# Patient Record
Sex: Male | Born: 1940 | Race: White | Hispanic: No | Marital: Married
Health system: Southern US, Community
[De-identification: ages and names within clinical notes are randomized; demographics above are authoritative.]

---

## 1997-09-10 ENCOUNTER — Ambulatory Visit (HOSPITAL_COMMUNITY): Admission: RE | Admit: 1997-09-10 | Discharge: 1997-09-10 | Payer: Self-pay | Admitting: Neurosurgery

## 1998-05-30 ENCOUNTER — Ambulatory Visit (HOSPITAL_COMMUNITY): Admission: RE | Admit: 1998-05-30 | Discharge: 1998-05-30 | Payer: Self-pay | Admitting: Neurosurgery

## 1998-05-30 ENCOUNTER — Encounter: Payer: Self-pay | Admitting: Neurosurgery

## 1998-10-24 ENCOUNTER — Encounter: Payer: Self-pay | Admitting: Neurosurgery

## 1998-10-24 ENCOUNTER — Ambulatory Visit (HOSPITAL_COMMUNITY): Admission: RE | Admit: 1998-10-24 | Discharge: 1998-10-24 | Payer: Self-pay | Admitting: Neurosurgery

## 1999-03-10 ENCOUNTER — Encounter: Payer: Self-pay | Admitting: Neurosurgery

## 1999-03-10 ENCOUNTER — Ambulatory Visit (HOSPITAL_COMMUNITY): Admission: RE | Admit: 1999-03-10 | Discharge: 1999-03-10 | Payer: Self-pay | Admitting: Neurosurgery

## 1999-12-01 ENCOUNTER — Encounter: Payer: Self-pay | Admitting: Neurosurgery

## 1999-12-02 ENCOUNTER — Ambulatory Visit (HOSPITAL_COMMUNITY): Admission: RE | Admit: 1999-12-02 | Discharge: 1999-12-02 | Payer: Self-pay | Admitting: Neurosurgery

## 2000-04-05 ENCOUNTER — Inpatient Hospital Stay (HOSPITAL_COMMUNITY): Admission: EM | Admit: 2000-04-05 | Discharge: 2000-04-06 | Payer: Self-pay | Admitting: *Deleted

## 2003-10-05 ENCOUNTER — Emergency Department (HOSPITAL_COMMUNITY): Admission: EM | Admit: 2003-10-05 | Discharge: 2003-10-05 | Payer: Self-pay | Admitting: Emergency Medicine

## 2004-05-22 ENCOUNTER — Ambulatory Visit: Payer: Self-pay | Admitting: Cardiology

## 2004-05-22 ENCOUNTER — Inpatient Hospital Stay (HOSPITAL_COMMUNITY): Admission: AD | Admit: 2004-05-22 | Discharge: 2004-05-25 | Payer: Self-pay | Admitting: Cardiology

## 2004-06-03 ENCOUNTER — Ambulatory Visit: Payer: Self-pay | Admitting: Internal Medicine

## 2004-06-08 ENCOUNTER — Ambulatory Visit: Payer: Self-pay | Admitting: Cardiology

## 2004-06-22 ENCOUNTER — Ambulatory Visit: Payer: Self-pay | Admitting: Cardiology

## 2004-06-26 ENCOUNTER — Ambulatory Visit: Payer: Self-pay | Admitting: Cardiology

## 2004-07-22 ENCOUNTER — Ambulatory Visit: Payer: Self-pay | Admitting: Cardiology

## 2004-08-24 ENCOUNTER — Ambulatory Visit: Payer: Self-pay | Admitting: Cardiology

## 2004-10-07 ENCOUNTER — Ambulatory Visit: Payer: Self-pay | Admitting: Cardiology

## 2004-10-29 ENCOUNTER — Emergency Department (HOSPITAL_COMMUNITY): Admission: EM | Admit: 2004-10-29 | Discharge: 2004-10-29 | Payer: Self-pay | Admitting: Emergency Medicine

## 2004-10-29 ENCOUNTER — Ambulatory Visit: Payer: Self-pay | Admitting: Internal Medicine

## 2004-10-30 ENCOUNTER — Ambulatory Visit: Payer: Self-pay | Admitting: Internal Medicine

## 2004-12-01 ENCOUNTER — Ambulatory Visit: Payer: Self-pay | Admitting: Internal Medicine

## 2004-12-04 ENCOUNTER — Emergency Department (HOSPITAL_COMMUNITY): Admission: EM | Admit: 2004-12-04 | Discharge: 2004-12-04 | Payer: Self-pay | Admitting: Emergency Medicine

## 2004-12-14 ENCOUNTER — Ambulatory Visit: Payer: Self-pay | Admitting: Cardiology

## 2005-01-20 ENCOUNTER — Ambulatory Visit: Payer: Self-pay

## 2005-02-10 ENCOUNTER — Ambulatory Visit: Payer: Self-pay | Admitting: Cardiology

## 2005-02-13 ENCOUNTER — Emergency Department (HOSPITAL_COMMUNITY): Admission: EM | Admit: 2005-02-13 | Discharge: 2005-02-13 | Payer: Self-pay | Admitting: Emergency Medicine

## 2005-02-19 ENCOUNTER — Ambulatory Visit: Payer: Self-pay | Admitting: Cardiology

## 2005-03-04 ENCOUNTER — Ambulatory Visit: Payer: Self-pay | Admitting: Cardiology

## 2005-03-26 ENCOUNTER — Ambulatory Visit: Payer: Self-pay | Admitting: Cardiology

## 2005-05-20 ENCOUNTER — Ambulatory Visit: Payer: Self-pay | Admitting: Cardiology

## 2005-06-15 ENCOUNTER — Ambulatory Visit: Payer: Self-pay | Admitting: Cardiology

## 2005-07-01 ENCOUNTER — Ambulatory Visit: Payer: Self-pay | Admitting: Cardiology

## 2005-08-31 ENCOUNTER — Ambulatory Visit: Payer: Self-pay | Admitting: Cardiology

## 2006-01-12 ENCOUNTER — Ambulatory Visit: Payer: Self-pay | Admitting: Cardiology

## 2006-01-12 ENCOUNTER — Observation Stay (HOSPITAL_COMMUNITY): Admission: AD | Admit: 2006-01-12 | Discharge: 2006-01-13 | Payer: Self-pay | Admitting: Cardiology

## 2006-01-25 ENCOUNTER — Encounter: Payer: Self-pay | Admitting: Cardiology

## 2006-01-25 ENCOUNTER — Ambulatory Visit: Payer: Self-pay

## 2006-02-03 ENCOUNTER — Ambulatory Visit: Payer: Self-pay | Admitting: Cardiology

## 2006-03-14 ENCOUNTER — Ambulatory Visit: Payer: Self-pay | Admitting: Cardiology

## 2006-04-24 ENCOUNTER — Emergency Department (HOSPITAL_COMMUNITY): Admission: EM | Admit: 2006-04-24 | Discharge: 2006-04-24 | Payer: Self-pay | Admitting: Emergency Medicine

## 2006-04-26 ENCOUNTER — Ambulatory Visit: Payer: Self-pay | Admitting: Cardiology

## 2006-05-04 ENCOUNTER — Ambulatory Visit: Payer: Self-pay | Admitting: Internal Medicine

## 2006-05-04 ENCOUNTER — Ambulatory Visit: Payer: Self-pay

## 2006-05-06 ENCOUNTER — Ambulatory Visit: Payer: Self-pay | Admitting: Gastroenterology

## 2006-05-18 ENCOUNTER — Ambulatory Visit: Payer: Self-pay | Admitting: Cardiology

## 2006-05-31 ENCOUNTER — Ambulatory Visit: Payer: Self-pay | Admitting: Internal Medicine

## 2006-06-16 ENCOUNTER — Ambulatory Visit: Payer: Self-pay

## 2006-06-21 ENCOUNTER — Ambulatory Visit: Payer: Self-pay | Admitting: Cardiology

## 2006-07-13 ENCOUNTER — Ambulatory Visit: Payer: Self-pay | Admitting: Internal Medicine

## 2006-10-28 ENCOUNTER — Emergency Department (HOSPITAL_COMMUNITY): Admission: EM | Admit: 2006-10-28 | Discharge: 2006-10-28 | Payer: Self-pay | Admitting: Emergency Medicine

## 2006-11-02 ENCOUNTER — Ambulatory Visit: Payer: Self-pay

## 2006-11-08 ENCOUNTER — Ambulatory Visit: Payer: Self-pay | Admitting: Cardiology

## 2006-12-05 ENCOUNTER — Ambulatory Visit: Payer: Self-pay | Admitting: Gastroenterology

## 2006-12-05 LAB — CONVERTED CEMR LAB
Albumin: 3.9 g/dL (ref 3.5–5.2)
Alkaline Phosphatase: 44 units/L (ref 39–117)
BUN: 14 mg/dL (ref 6–23)
Basophils Absolute: 0 10*3/uL (ref 0.0–0.1)
Calcium: 8.9 mg/dL (ref 8.4–10.5)
Eosinophils Absolute: 0.1 10*3/uL (ref 0.0–0.6)
GFR calc non Af Amer: 54 mL/min
Glucose, Bld: 115 mg/dL — ABNORMAL HIGH (ref 70–99)
Lymphocytes Relative: 20.5 % (ref 12.0–46.0)
MCHC: 34.4 g/dL (ref 30.0–36.0)
MCV: 93.7 fL (ref 78.0–100.0)
Neutrophils Relative %: 68.3 % (ref 43.0–77.0)
Platelets: 176 10*3/uL (ref 150–400)
RBC: 4.4 M/uL (ref 4.22–5.81)
Total Bilirubin: 1.3 mg/dL — ABNORMAL HIGH (ref 0.3–1.2)
Total Protein: 6.9 g/dL (ref 6.0–8.3)

## 2006-12-06 ENCOUNTER — Ambulatory Visit: Payer: Self-pay | Admitting: Gastroenterology

## 2007-01-11 ENCOUNTER — Ambulatory Visit: Payer: Self-pay | Admitting: Internal Medicine

## 2007-01-13 ENCOUNTER — Ambulatory Visit: Payer: Self-pay | Admitting: Internal Medicine

## 2007-01-13 LAB — CONVERTED CEMR LAB
Cholesterol: 136 mg/dL (ref 0–200)
Glucose, 2 hour: 253 mg/dL — ABNORMAL HIGH (ref 70–139)
HDL: 22.5 mg/dL — ABNORMAL LOW (ref >39.0)
PSA: 0.75 ng/mL (ref 0.10–4.00)
Total CHOL/HDL Ratio: 6

## 2007-01-16 ENCOUNTER — Ambulatory Visit: Payer: Self-pay | Admitting: Internal Medicine

## 2007-01-30 ENCOUNTER — Encounter: Payer: Self-pay | Admitting: Internal Medicine

## 2007-01-30 ENCOUNTER — Ambulatory Visit: Payer: Self-pay | Admitting: Internal Medicine

## 2007-02-08 ENCOUNTER — Ambulatory Visit (HOSPITAL_COMMUNITY): Admission: RE | Admit: 2007-02-08 | Discharge: 2007-02-09 | Payer: Self-pay | Admitting: Surgery

## 2007-02-08 ENCOUNTER — Encounter (INDEPENDENT_AMBULATORY_CARE_PROVIDER_SITE_OTHER): Payer: Self-pay | Admitting: Surgery

## 2007-02-14 ENCOUNTER — Ambulatory Visit: Payer: Self-pay | Admitting: Internal Medicine

## 2007-02-14 ENCOUNTER — Encounter: Payer: Self-pay | Admitting: Internal Medicine

## 2007-03-07 ENCOUNTER — Ambulatory Visit: Payer: Self-pay | Admitting: Cardiology

## 2007-03-30 ENCOUNTER — Ambulatory Visit: Payer: Self-pay | Admitting: Cardiology

## 2007-04-10 ENCOUNTER — Ambulatory Visit: Payer: Self-pay | Admitting: Cardiology

## 2007-04-10 ENCOUNTER — Ambulatory Visit: Payer: Self-pay

## 2007-05-24 ENCOUNTER — Observation Stay (HOSPITAL_COMMUNITY): Admission: EM | Admit: 2007-05-24 | Discharge: 2007-05-25 | Payer: Self-pay | Admitting: Emergency Medicine

## 2007-05-24 ENCOUNTER — Ambulatory Visit: Payer: Self-pay | Admitting: Internal Medicine

## 2007-05-29 ENCOUNTER — Ambulatory Visit: Payer: Self-pay | Admitting: Internal Medicine

## 2007-05-30 DIAGNOSIS — Z87442 Personal history of urinary calculi: Secondary | ICD-10-CM

## 2007-05-30 DIAGNOSIS — I10 Essential (primary) hypertension: Secondary | ICD-10-CM | POA: Insufficient documentation

## 2007-05-30 DIAGNOSIS — M109 Gout, unspecified: Secondary | ICD-10-CM

## 2007-05-30 DIAGNOSIS — I2589 Other forms of chronic ischemic heart disease: Secondary | ICD-10-CM | POA: Insufficient documentation

## 2007-05-30 DIAGNOSIS — I498 Other specified cardiac arrhythmias: Secondary | ICD-10-CM

## 2007-05-30 DIAGNOSIS — E785 Hyperlipidemia, unspecified: Secondary | ICD-10-CM

## 2007-05-30 DIAGNOSIS — Z8612 Personal history of poliomyelitis: Secondary | ICD-10-CM

## 2007-05-30 DIAGNOSIS — I739 Peripheral vascular disease, unspecified: Secondary | ICD-10-CM

## 2007-05-30 DIAGNOSIS — K8020 Calculus of gallbladder without cholecystitis without obstruction: Secondary | ICD-10-CM | POA: Insufficient documentation

## 2007-05-30 DIAGNOSIS — C4359 Malignant melanoma of other part of trunk: Secondary | ICD-10-CM

## 2007-06-05 ENCOUNTER — Ambulatory Visit: Payer: Self-pay | Admitting: Cardiology

## 2007-06-09 ENCOUNTER — Ambulatory Visit: Payer: Self-pay | Admitting: Cardiology

## 2007-07-17 ENCOUNTER — Ambulatory Visit: Payer: Self-pay | Admitting: Cardiology

## 2007-07-21 ENCOUNTER — Telehealth: Payer: Self-pay | Admitting: Internal Medicine

## 2007-08-22 DIAGNOSIS — K219 Gastro-esophageal reflux disease without esophagitis: Secondary | ICD-10-CM | POA: Insufficient documentation

## 2007-09-26 ENCOUNTER — Telehealth: Payer: Self-pay | Admitting: Internal Medicine

## 2007-10-24 ENCOUNTER — Ambulatory Visit: Payer: Self-pay

## 2007-12-05 ENCOUNTER — Encounter: Payer: Self-pay | Admitting: Internal Medicine

## 2007-12-11 ENCOUNTER — Ambulatory Visit: Payer: Self-pay | Admitting: Internal Medicine

## 2007-12-11 DIAGNOSIS — M549 Dorsalgia, unspecified: Secondary | ICD-10-CM | POA: Insufficient documentation

## 2007-12-11 DIAGNOSIS — I872 Venous insufficiency (chronic) (peripheral): Secondary | ICD-10-CM | POA: Insufficient documentation

## 2007-12-14 ENCOUNTER — Encounter: Payer: Self-pay | Admitting: Internal Medicine

## 2007-12-14 ENCOUNTER — Emergency Department (HOSPITAL_COMMUNITY): Admission: EM | Admit: 2007-12-14 | Discharge: 2007-12-14 | Payer: Self-pay | Admitting: Emergency Medicine

## 2007-12-14 ENCOUNTER — Ambulatory Visit: Payer: Self-pay | Admitting: Internal Medicine

## 2007-12-20 ENCOUNTER — Ambulatory Visit: Payer: Self-pay | Admitting: Cardiology

## 2008-01-17 ENCOUNTER — Ambulatory Visit: Payer: Self-pay | Admitting: Cardiology

## 2008-02-20 ENCOUNTER — Ambulatory Visit: Payer: Self-pay | Admitting: Cardiology

## 2008-05-15 ENCOUNTER — Ambulatory Visit: Payer: Self-pay | Admitting: Cardiology

## 2008-06-27 ENCOUNTER — Ambulatory Visit: Payer: Self-pay | Admitting: Cardiology

## 2008-06-27 LAB — CONVERTED CEMR LAB
BUN: 19 mg/dL (ref 6–23)
Glucose, Bld: 113 mg/dL — ABNORMAL HIGH (ref 70–99)
Potassium: 3.5 meq/L (ref 3.5–5.1)
Sodium: 143 meq/L (ref 135–145)

## 2008-07-31 ENCOUNTER — Telehealth: Payer: Self-pay | Admitting: Internal Medicine

## 2008-08-01 ENCOUNTER — Ambulatory Visit: Payer: Self-pay | Admitting: Cardiology

## 2008-08-01 ENCOUNTER — Encounter: Payer: Self-pay | Admitting: Cardiology

## 2008-08-08 ENCOUNTER — Telehealth: Payer: Self-pay | Admitting: Internal Medicine

## 2008-08-13 ENCOUNTER — Telehealth: Payer: Self-pay | Admitting: Internal Medicine

## 2008-09-02 ENCOUNTER — Telehealth: Payer: Self-pay | Admitting: Internal Medicine

## 2008-09-09 ENCOUNTER — Telehealth: Payer: Self-pay | Admitting: Internal Medicine

## 2008-10-14 ENCOUNTER — Ambulatory Visit: Payer: Self-pay | Admitting: Internal Medicine

## 2008-10-14 ENCOUNTER — Ambulatory Visit: Payer: Self-pay | Admitting: Cardiology

## 2008-10-14 ENCOUNTER — Observation Stay (HOSPITAL_COMMUNITY): Admission: EM | Admit: 2008-10-14 | Discharge: 2008-10-15 | Payer: Self-pay | Admitting: Emergency Medicine

## 2008-10-15 ENCOUNTER — Encounter (INDEPENDENT_AMBULATORY_CARE_PROVIDER_SITE_OTHER): Payer: Self-pay | Admitting: Internal Medicine

## 2008-10-23 ENCOUNTER — Encounter: Payer: Self-pay | Admitting: Cardiology

## 2008-10-25 ENCOUNTER — Ambulatory Visit: Payer: Self-pay | Admitting: Cardiology

## 2008-11-01 ENCOUNTER — Telehealth: Payer: Self-pay | Admitting: Internal Medicine

## 2008-11-07 ENCOUNTER — Telehealth: Payer: Self-pay | Admitting: Internal Medicine

## 2008-11-07 ENCOUNTER — Ambulatory Visit: Payer: Self-pay | Admitting: Internal Medicine

## 2008-11-07 LAB — CONVERTED CEMR LAB
Calcium: 8.4 mg/dL (ref 8.4–10.5)
GFR calc non Af Amer: 53.58 mL/min (ref >60)

## 2008-11-18 ENCOUNTER — Encounter: Payer: Self-pay | Admitting: Cardiology

## 2008-11-19 ENCOUNTER — Ambulatory Visit: Payer: Self-pay | Admitting: Cardiology

## 2008-11-21 ENCOUNTER — Telehealth: Payer: Self-pay | Admitting: Cardiology

## 2008-11-25 ENCOUNTER — Telehealth: Payer: Self-pay | Admitting: Cardiology

## 2008-12-21 ENCOUNTER — Encounter: Payer: Self-pay | Admitting: Internal Medicine

## 2009-02-11 ENCOUNTER — Telehealth: Payer: Self-pay | Admitting: Cardiology

## 2009-03-05 ENCOUNTER — Encounter: Payer: Self-pay | Admitting: Internal Medicine

## 2009-03-05 ENCOUNTER — Encounter: Payer: Self-pay | Admitting: Cardiology

## 2009-03-06 ENCOUNTER — Encounter: Payer: Self-pay | Admitting: Cardiology

## 2009-03-06 DIAGNOSIS — R42 Dizziness and giddiness: Secondary | ICD-10-CM | POA: Insufficient documentation

## 2009-03-06 DIAGNOSIS — I6529 Occlusion and stenosis of unspecified carotid artery: Secondary | ICD-10-CM

## 2009-03-07 ENCOUNTER — Ambulatory Visit: Payer: Self-pay | Admitting: Cardiology

## 2009-03-07 DIAGNOSIS — M216X9 Other acquired deformities of unspecified foot: Secondary | ICD-10-CM

## 2009-03-11 ENCOUNTER — Ambulatory Visit: Payer: Self-pay | Admitting: Internal Medicine

## 2009-03-21 ENCOUNTER — Encounter: Payer: Self-pay | Admitting: Cardiology

## 2009-03-21 ENCOUNTER — Ambulatory Visit: Payer: Self-pay

## 2009-03-24 ENCOUNTER — Telehealth: Payer: Self-pay | Admitting: Internal Medicine

## 2009-03-25 ENCOUNTER — Ambulatory Visit: Payer: Self-pay | Admitting: Internal Medicine

## 2009-03-25 ENCOUNTER — Inpatient Hospital Stay (HOSPITAL_COMMUNITY): Admission: EM | Admit: 2009-03-25 | Discharge: 2009-03-27 | Payer: Self-pay | Admitting: Internal Medicine

## 2009-03-25 ENCOUNTER — Telehealth: Payer: Self-pay | Admitting: Cardiology

## 2009-03-25 ENCOUNTER — Telehealth (INDEPENDENT_AMBULATORY_CARE_PROVIDER_SITE_OTHER): Payer: Self-pay | Admitting: *Deleted

## 2009-03-27 ENCOUNTER — Telehealth: Payer: Self-pay | Admitting: Internal Medicine

## 2009-04-03 ENCOUNTER — Inpatient Hospital Stay (HOSPITAL_COMMUNITY): Admission: EM | Admit: 2009-04-03 | Discharge: 2009-04-10 | Payer: Self-pay | Admitting: Emergency Medicine

## 2009-04-03 ENCOUNTER — Ambulatory Visit: Payer: Self-pay | Admitting: Internal Medicine

## 2009-04-21 ENCOUNTER — Ambulatory Visit: Payer: Self-pay | Admitting: Cardiology

## 2009-04-28 ENCOUNTER — Telehealth: Payer: Self-pay | Admitting: Internal Medicine

## 2009-04-29 ENCOUNTER — Ambulatory Visit: Payer: Self-pay | Admitting: Internal Medicine

## 2009-04-29 DIAGNOSIS — I5022 Chronic systolic (congestive) heart failure: Secondary | ICD-10-CM

## 2009-04-30 ENCOUNTER — Encounter: Payer: Self-pay | Admitting: Internal Medicine

## 2009-05-06 ENCOUNTER — Ambulatory Visit: Payer: Self-pay | Admitting: Internal Medicine

## 2009-05-08 LAB — CONVERTED CEMR LAB
BUN: 32 mg/dL — ABNORMAL HIGH (ref 6–23)
Chloride: 106 meq/L (ref 96–112)
Creatinine, Ser: 1.7 mg/dL — ABNORMAL HIGH (ref 0.4–1.5)
Glucose, Bld: 85 mg/dL (ref 70–99)
INR: 1.1 — ABNORMAL HIGH (ref 0.8–1.0)
Lymphocytes Relative: 21.8 % (ref 12.0–46.0)
MCHC: 34.1 g/dL (ref 30.0–36.0)
MCV: 95.9 fL (ref 78.0–100.0)
Neutrophils Relative %: 67.1 % (ref 43.0–77.0)
Platelets: 98 10*3/uL — ABNORMAL LOW (ref 150.0–400.0)
Sodium: 147 meq/L — ABNORMAL HIGH (ref 135–145)

## 2009-05-12 ENCOUNTER — Ambulatory Visit: Payer: Self-pay | Admitting: Internal Medicine

## 2009-05-12 ENCOUNTER — Inpatient Hospital Stay (HOSPITAL_COMMUNITY): Admission: RE | Admit: 2009-05-12 | Discharge: 2009-05-13 | Payer: Self-pay | Admitting: Internal Medicine

## 2009-05-13 ENCOUNTER — Encounter: Payer: Self-pay | Admitting: Internal Medicine

## 2009-05-14 ENCOUNTER — Encounter: Payer: Self-pay | Admitting: Internal Medicine

## 2009-05-15 ENCOUNTER — Encounter: Payer: Self-pay | Admitting: Internal Medicine

## 2009-05-16 ENCOUNTER — Encounter: Payer: Self-pay | Admitting: Internal Medicine

## 2009-05-22 ENCOUNTER — Telehealth: Payer: Self-pay | Admitting: Cardiology

## 2009-05-28 ENCOUNTER — Encounter: Payer: Self-pay | Admitting: Internal Medicine

## 2009-05-28 ENCOUNTER — Ambulatory Visit: Payer: Self-pay

## 2009-05-31 ENCOUNTER — Ambulatory Visit: Payer: Self-pay | Admitting: Cardiology

## 2009-05-31 ENCOUNTER — Inpatient Hospital Stay (HOSPITAL_COMMUNITY): Admission: EM | Admit: 2009-05-31 | Discharge: 2009-06-05 | Payer: Self-pay | Admitting: Emergency Medicine

## 2009-06-02 ENCOUNTER — Encounter: Payer: Self-pay | Admitting: Cardiology

## 2009-06-06 ENCOUNTER — Telehealth: Payer: Self-pay | Admitting: Cardiology

## 2009-06-06 ENCOUNTER — Telehealth: Payer: Self-pay | Admitting: Internal Medicine

## 2009-06-15 ENCOUNTER — Encounter: Payer: Self-pay | Admitting: Cardiology

## 2009-06-16 ENCOUNTER — Ambulatory Visit: Payer: Self-pay | Admitting: Cardiology

## 2009-06-16 DIAGNOSIS — R609 Edema, unspecified: Secondary | ICD-10-CM

## 2009-06-16 DIAGNOSIS — I2510 Atherosclerotic heart disease of native coronary artery without angina pectoris: Secondary | ICD-10-CM | POA: Insufficient documentation

## 2009-06-16 DIAGNOSIS — N259 Disorder resulting from impaired renal tubular function, unspecified: Secondary | ICD-10-CM | POA: Insufficient documentation

## 2009-06-16 DIAGNOSIS — E876 Hypokalemia: Secondary | ICD-10-CM

## 2009-06-17 LAB — CONVERTED CEMR LAB
BUN: 33 mg/dL — ABNORMAL HIGH (ref 6–23)
Chloride: 102 meq/L (ref 96–112)
Creatinine, Ser: 1.8 mg/dL — ABNORMAL HIGH (ref 0.4–1.5)
Glucose, Bld: 76 mg/dL (ref 70–99)

## 2009-06-26 ENCOUNTER — Ambulatory Visit: Payer: Self-pay | Admitting: Internal Medicine

## 2009-06-26 ENCOUNTER — Encounter: Payer: Self-pay | Admitting: Cardiology

## 2009-06-27 ENCOUNTER — Ambulatory Visit: Payer: Self-pay | Admitting: Cardiology

## 2009-07-03 ENCOUNTER — Telehealth: Payer: Self-pay | Admitting: Cardiology

## 2009-07-15 ENCOUNTER — Telehealth: Payer: Self-pay | Admitting: Cardiology

## 2009-07-24 ENCOUNTER — Ambulatory Visit: Payer: Self-pay | Admitting: Cardiology

## 2009-08-12 ENCOUNTER — Ambulatory Visit: Payer: Self-pay | Admitting: Internal Medicine

## 2009-08-12 DIAGNOSIS — Z95810 Presence of automatic (implantable) cardiac defibrillator: Secondary | ICD-10-CM | POA: Insufficient documentation

## 2009-08-21 ENCOUNTER — Encounter: Payer: Self-pay | Admitting: Internal Medicine

## 2009-08-26 ENCOUNTER — Telehealth: Payer: Self-pay | Admitting: Cardiology

## 2009-09-01 ENCOUNTER — Ambulatory Visit: Payer: Self-pay | Admitting: Cardiology

## 2009-09-01 ENCOUNTER — Ambulatory Visit: Payer: Self-pay | Admitting: Internal Medicine

## 2009-09-11 ENCOUNTER — Ambulatory Visit: Payer: Self-pay | Admitting: Internal Medicine

## 2009-09-11 LAB — CONVERTED CEMR LAB
BUN: 53 mg/dL — ABNORMAL HIGH (ref 6–23)
CO2: 35 meq/L — ABNORMAL HIGH (ref 19–32)
GFR calc non Af Amer: 25.37 mL/min (ref >60)
Potassium: 3 meq/L — ABNORMAL LOW (ref 3.5–5.1)

## 2009-10-01 ENCOUNTER — Telehealth: Payer: Self-pay | Admitting: Cardiology

## 2009-10-22 ENCOUNTER — Ambulatory Visit: Payer: Self-pay | Admitting: Internal Medicine

## 2009-10-23 DIAGNOSIS — IMO0002 Reserved for concepts with insufficient information to code with codable children: Secondary | ICD-10-CM | POA: Insufficient documentation

## 2009-10-23 DIAGNOSIS — M1710 Unilateral primary osteoarthritis, unspecified knee: Secondary | ICD-10-CM

## 2009-10-31 ENCOUNTER — Telehealth: Payer: Self-pay | Admitting: Cardiology

## 2009-11-03 ENCOUNTER — Telehealth: Payer: Self-pay | Admitting: Cardiology

## 2009-11-12 ENCOUNTER — Ambulatory Visit: Payer: Self-pay | Admitting: Internal Medicine

## 2009-11-12 LAB — CONVERTED CEMR LAB
Calcium: 8.4 mg/dL (ref 8.4–10.5)
Chloride: 107 meq/L (ref 96–112)
Creatinine, Ser: 1.8 mg/dL — ABNORMAL HIGH (ref 0.4–1.5)
Potassium: 3.8 meq/L (ref 3.5–5.1)
Sodium: 148 meq/L — ABNORMAL HIGH (ref 135–145)

## 2009-11-14 ENCOUNTER — Encounter: Payer: Self-pay | Admitting: Internal Medicine

## 2009-11-17 ENCOUNTER — Telehealth: Payer: Self-pay | Admitting: Internal Medicine

## 2009-11-19 ENCOUNTER — Ambulatory Visit: Payer: Self-pay | Admitting: Internal Medicine

## 2009-11-19 DIAGNOSIS — F329 Major depressive disorder, single episode, unspecified: Secondary | ICD-10-CM

## 2009-11-19 DIAGNOSIS — I951 Orthostatic hypotension: Secondary | ICD-10-CM | POA: Insufficient documentation

## 2009-11-20 ENCOUNTER — Telehealth: Payer: Self-pay | Admitting: Cardiology

## 2009-11-24 ENCOUNTER — Ambulatory Visit: Payer: Self-pay | Admitting: Internal Medicine

## 2009-11-27 ENCOUNTER — Encounter: Payer: Self-pay | Admitting: Internal Medicine

## 2009-11-27 ENCOUNTER — Ambulatory Visit: Payer: Self-pay | Admitting: Cardiology

## 2009-11-27 LAB — CONVERTED CEMR LAB
Chloride: 110 meq/L (ref 96–112)
Creatinine, Ser: 1.8 mg/dL — ABNORMAL HIGH (ref 0.4–1.5)
Potassium: 3.5 meq/L (ref 3.5–5.1)
Sodium: 149 meq/L — ABNORMAL HIGH (ref 135–145)

## 2009-12-04 ENCOUNTER — Telehealth: Payer: Self-pay | Admitting: Cardiology

## 2009-12-11 ENCOUNTER — Telehealth: Payer: Self-pay | Admitting: Internal Medicine

## 2009-12-18 ENCOUNTER — Ambulatory Visit: Payer: Self-pay | Admitting: Cardiology

## 2009-12-30 ENCOUNTER — Ambulatory Visit: Payer: Self-pay | Admitting: Cardiology

## 2009-12-31 LAB — CONVERTED CEMR LAB
BUN: 20 mg/dL (ref 6–23)
CO2: 32 meq/L (ref 19–32)
Chloride: 104 meq/L (ref 96–112)
Creatinine, Ser: 1.7 mg/dL — ABNORMAL HIGH (ref 0.4–1.5)
Potassium: 3 meq/L — ABNORMAL LOW (ref 3.5–5.1)

## 2010-01-13 ENCOUNTER — Ambulatory Visit: Payer: Self-pay | Admitting: Cardiology

## 2010-02-02 ENCOUNTER — Ambulatory Visit: Payer: Self-pay | Admitting: Cardiology

## 2010-02-17 ENCOUNTER — Encounter: Payer: Self-pay | Admitting: Cardiology

## 2010-02-17 ENCOUNTER — Encounter: Payer: Self-pay | Admitting: Internal Medicine

## 2010-02-25 ENCOUNTER — Telehealth: Payer: Self-pay | Admitting: Internal Medicine

## 2010-03-05 ENCOUNTER — Encounter: Payer: Self-pay | Admitting: Internal Medicine

## 2010-03-05 ENCOUNTER — Ambulatory Visit: Payer: Self-pay | Admitting: Cardiology

## 2010-03-18 ENCOUNTER — Encounter: Payer: Self-pay | Admitting: Internal Medicine

## 2010-05-14 ENCOUNTER — Telehealth: Payer: Self-pay | Admitting: Cardiology

## 2010-06-02 ENCOUNTER — Ambulatory Visit: Admit: 2010-06-02 | Payer: Self-pay | Admitting: Internal Medicine

## 2010-06-11 ENCOUNTER — Ambulatory Visit (INDEPENDENT_AMBULATORY_CARE_PROVIDER_SITE_OTHER): Payer: Medicare Other | Admitting: Cardiology

## 2010-06-11 ENCOUNTER — Encounter (INDEPENDENT_AMBULATORY_CARE_PROVIDER_SITE_OTHER): Payer: Medicare Other

## 2010-06-11 ENCOUNTER — Encounter: Payer: Self-pay | Admitting: Cardiology

## 2010-06-11 ENCOUNTER — Encounter: Payer: Self-pay | Admitting: Internal Medicine

## 2010-06-11 DIAGNOSIS — I2589 Other forms of chronic ischemic heart disease: Secondary | ICD-10-CM

## 2010-06-11 DIAGNOSIS — I6529 Occlusion and stenosis of unspecified carotid artery: Secondary | ICD-10-CM

## 2010-06-11 DIAGNOSIS — R42 Dizziness and giddiness: Secondary | ICD-10-CM

## 2010-06-11 DIAGNOSIS — R0989 Other specified symptoms and signs involving the circulatory and respiratory systems: Secondary | ICD-10-CM | POA: Insufficient documentation

## 2010-06-11 DIAGNOSIS — I951 Orthostatic hypotension: Secondary | ICD-10-CM

## 2010-06-11 DIAGNOSIS — I2510 Atherosclerotic heart disease of native coronary artery without angina pectoris: Secondary | ICD-10-CM

## 2010-06-11 NOTE — Progress Notes (Signed)
Summary: Pt calling regarding medciation Lasix   Phone Note Call from Patient Call back at [phone]   Caller: Patient Summary of Call: Pt calling regarding Lasix Initial call taken by: Judie Grieve,  November 20, 2009 1:12 PM  Follow-up for Phone Call        pt has been having dizzy spells for 3 weeks, was put on Valium by ER, saw Dr Debby Bud put him on a prescription that didn't help and yest he saw Dr Jonny Ruiz and he was orthostatic so his lasix was stopped for several days and he is sch to see Dr Debby Bud back on Mon 7/18, will make Dr Myrtis Ser aware of what has been going on [employer], RN  November 20, 2009 1:31 PM      Appended Document: Pt calling regarding medciation Lasix Make sure he does not gain too much weight. If so, restart lasix.

## 2010-06-11 NOTE — Progress Notes (Signed)
Summary: cough/congestion  Medications Added * HYCODAN 1.5MG  PER 5 ML take every 6-8hours ZITHROMAX Z-PAK 250 MG TABS (AZITHROMYCIN) take as directed       Phone Note Call from Patient   Caller: Patient Summary of Call: pt called and stated that his wife passed away on Christmas Day, he also states that he has a productive cough and chest congestion, Dr Myrtis Ser spoke w/pt, rx called in for a z-pak and hycodan  Initial call taken by: Meredith Staggers, RN,  May 14, 2010 9:30 AM    New/Updated Medications: * HYCODAN 1.5MG  PER 5 ML take every 6-8hours ZITHROMAX Z-PAK 250 MG TABS (AZITHROMYCIN) take as directed Prescriptions: ZITHROMAX Z-PAK 250 MG TABS (AZITHROMYCIN) take as directed  #1 pack x 0   Entered by:   Meredith Staggers, RN   Authorized by:   Talitha Givens, MD, [name]   Signed by:   Meredith Staggers, RN on 05/14/2010   Method used:   Telephoned to ...       [facility]  [address] (retail)       [address]       [place], [place]  [address]       Ph: [phone]       Fax: [phone]   RxID:   [id number] HYCODAN 1.5MG  PER 5 ML take every 6-8hours  #1 bottle x 0   Entered by:   Meredith Staggers, RN   Authorized by:   Talitha Givens, MD, [facility]   Signed by:   Meredith Staggers, RN on 05/14/2010   Method used:   Telephoned to ...       [facility]  [address] (retail)       [address]       [place], [place]  [address]       Ph: [phone]       Fax: [phone]   RxID:   [id number]

## 2010-06-11 NOTE — Progress Notes (Signed)
Summary: surgcial clearance   Phone Note Call from Patient   Caller: Patient Summary of Call: pt called on 5/24 to let us know he is having a lot of pain and trouble w/his knee and possibly may need a knee replacement in the near future and wanted Dr Myrtis Ser input, will discuss w/Dr Myrtis Ser and let pt know what he thinks  Initial call taken by: Meredith Staggers, RN,  Oct 01, 2009 2:18 PM  Follow-up for Phone Call        He would have significant risk from knee surgery, but not impossible. It also depends on the type of surgery. We would have to discuss in person. Myrtis Ser   spoke w/pt he is aware he states he is not ready for surgery now but if he does perue it in the future he will sch an appt to discuss w/Dr Boykin Nearing, RN  Oct 01, 2009 5:07 PM

## 2010-06-11 NOTE — Progress Notes (Signed)
Summary: medication questons   Phone Note Call from Patient Call back at [phone]   Caller: Patient Reason for Call: Talk to Nurse Summary of Call: pt has a question about what isosorbide he is suppose to be on Initial call taken by: Omer Jack,  May 22, 2009 9:50 AM  Follow-up for Phone Call        1100am-05/22/09--pt calling asking wether he should take isorbide monitrate or isorbide dinitrate--pt was advised to take isorbide monitrate which is the equivalent to imdur--pt agrees--nt Follow-up by: Ledon Snare, RN,  May 22, 2009 10:50 AM

## 2010-06-11 NOTE — Progress Notes (Signed)
Summary: defibulator  Phone Note Call from Patient Call back at [phone]   Caller: Patient Call For: young Summary of Call: would like to talk to nurse about using monitor at home for defibulator. Initial call taken by: Rickard Patience,  February 25, 2010 9:20 AM  Follow-up for Phone Call        pt states he received a call about monitoring his defilbulator. I advised he needs to be speaking cardiology not [place]. Pt asked to be transferred. I transferred pt to 752. Carron Curie CMA  February 25, 2010 11:53 AM

## 2010-06-11 NOTE — Assessment & Plan Note (Signed)
Summary: eph  Medications Added FUROSEMIDE 40 MG TABS (FUROSEMIDE) Take 2 tablets by mouth two times a day HYDRALAZINE HCL 100 MG TABS (HYDRALAZINE HCL) 3/4 tablet three times a day GABAPENTIN 300 MG CAPS (GABAPENTIN) three times a day METOLAZONE 2.5 MG TABS (METOLAZONE) Take one tablet by mouth daily as directed      Allergies Added:   Visit Type:  post hospital Referring Provider:  Zenovia Jordan, MD Primary Provider:  Jacques Navy MD   History of Present Illness: The patient is seen post hospitalization.  He had been admitted on May 31, 2009.  During that hospitalization he did diuresis.  He became combative on one evening.  It is difficult to explain his behavior.  He is becoming agitated but not combative in the hospital in the past.  He was treated with Haldol and improved.  Ultimately he stabilized.  We sent him home in his home weight upon arriving was 247 pounds.  Since that time he had to take his mother to the hospital briefly.  She is back to the nursing center and he is more stable.  He feels mildly bloated in the abdomen today.  Current Medications (verified): 1)  Carvedilol 6.25 Mg Tabs (Carvedilol) .... Take One Tablet By Mouth Twice A Day 2)  Furosemide 40 Mg Tabs (Furosemide) .... Take 2 Tablets By Mouth Two Times A Day 3)  Klor-Con M20 20 Meq  Tbcr (Potassium Chloride Crys Cr) .Marland Kitchen.. 1 By Mouth Two Times A Day 4)  Prilosec Otc 20 Mg Tbec (Omeprazole Magnesium) .... Take One Tablet By Mouth Once Daily. 5)  Aspirin 81 Mg  Tabs (Aspirin) .Marland Kitchen.. 1 By Mouth Daily 6)  Bethanechol Chloride 25 Mg Tabs (Bethanechol Chloride) .... Three Times A Day 7)  Allopurinol 300 Mg Tabs (Allopurinol) .... Take One Tablet By Mouth Once Daily. 8)  Restasis 0.05 %  Emul (Cyclosporine) .... One Drop Each Eye Two Times A Day 9)  Ocuvite Preservision  Tabs (Multiple Vitamins-Minerals) .Marland Kitchen.. 1 Two Times A Day 10)  Flomax 0.4 Mg Xr24h-Cap (Tamsulosin Hcl) .... Take One Tablet By Mouth Once  Daily. 11)  Nasal Moisturizing Spray 0.65 % Soln (Saline) .... Prn 12)  Alprazolam 0.25 Mg Tabs (Alprazolam) .... As Needed 13)  Plavix 75 Mg Tabs (Clopidogrel Bisulfate) .Marland Kitchen.. 1 By Mouth Daily 14)  Nitrostat 0.4 Mg Subl (Nitroglycerin) .... As Needed 15)  Acetaminophen 325 Mg  Tabs (Acetaminophen) .... As Needed 16)  Clonidine Hcl 0.1 Mg Tabs (Clonidine Hcl) .... One By Mouth Three Times A Day 17)  Isosorbide Mononitrate Cr 60 Mg Xr24h-Tab (Isosorbide Mononitrate) .... One By Mouth Three Times A Day 18)  Hydralazine Hcl 100 Mg Tabs (Hydralazine Hcl) .... 3/4 Tablet Three Times A Day 19)  Gabapentin 300 Mg Caps (Gabapentin) .... Three Times A Day  Allergies (verified): 1)  ! Cortisone 2)  ! Augmentin 3)  ! 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 severe native disease... LIMA to the LAD patent, SVG to OM patent but sequential grafts to the posterior lateral of the OM and then the posterior lateral of the right occluded, SVG to diagonal occluded SVG to the right coronary artery occluded.... this was unchanged since 2006...  /   hospitalization...1.2011.Marland Kitchen elevated troponin/CHF.Marland Kitchen no work-up ischemic cardiomyopathy ICD  placed  05/12/2009 EF  40%  echo..01/2006..inferior/posterior akinesis /  30% echo... June, 2010... technically limited study A/C systolic CHF.[facility] 05/31/2009..diuresed Combative in hospital  05/2009...treated  haldol... Carotid aretery disease...doppler..04/2007.Marland Kitchen0-39% RICA.Marland Kitchen60-79%  LICA  /   Doppler... March 21, 2009... 0-39% R. ICA, 60-79% LICA... unchanged he is no rebound Abdominal bruit but no abdominal aortic aneurysm and no renal artery stenosis Grief over wife having severe Alzheimer's bradycardia mild not  symptomatic Gout.Marland Kitchenallopurinol and p.r.n. colchicine Hypertension Nephrolithiasis, hx of Prostatitis, hx of  Hyperlipidemia.Marland Kitchenintolerant to all cholesterol medications Cholelithiasis Chronic kidney disease.. Dr. 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 removed at [facility] in the past Aortic root dilitation...mild.. Mild musculoskeletal back pain Renal and liver cysts on CT scan Chronic mild dizziness Augmentin allergy with swelling of the tongue Renal insufficiency... function varies  volume overloaded.  June 03, 2009... hospital discharge... BUN 30 / creatinine 1.67           Cardiology - Myrtis Ser          GU - Darvin Neighbours          Derm - Dr. Mayford Knife in [place]          Vasc Surgeon - Madilyn Fireman          GI - Yancey Flemings         GS - Gross  Review of Systems       Patient denies fever, chills, headache, sweats, rash, change in vision, change in hearing, chest pain, shortness of breath, cough, nausea vomiting, urinary symptoms.  All other systems are reviewed and are negative.  Vital Signs:  Patient profile:   70 year old male Height:      71 inches Weight:      249 pounds BMI:     34.85 O2 Sat:      97 % Pulse rate:   56 / minute BP sitting:   126 / 54  (left arm) Cuff size:   regular  Vitals Entered By: Hardin Negus, RMA (June 16, 2009 8:40 AM)  Physical Exam  General:  patient is stable today. Head:  head is atraumatic. Eyes:  no xanthelasma. Neck:  no jugular venous distention. Chest Wall:  no chest wall tenderness. Lungs:  lungs are clear.  Respiratory effort is nonlabored. Heart:  cardiac exam reveals S1-S2.  No clicks or significant murmurs. Abdomen:  abdomen is soft. Msk:  no musculoskeletal deformities. Extremities:  mild peripheral edema slightly greater in the left ankle. Skin:  ecchymoses on the arms Psych:  patient is oriented to person time and place.  Affect is normal.    ICD  Specifications Following MD:  Lewayne Bunting, MD     ICD Vendor:  [employer]     ICD Model Number:  [id number]     ICD Serial Number:  [id number] ICD DOI:  05/12/2009     ICD Implanting MD:  Lewayne Bunting, MD  Lead 1:    Location: RV     DOI: 05/12/2009     Model #: [id number]     Serial #: [id number]     Status: active  ICD Follow Up ICD Dependent:  No      Episodes Coumadin:  No  Brady Parameters Mode VVI     Lower Rate Limit:  40      Tachy Zones VF:  240     VT:  200     VT1:  173     Impression & Recommendations:  Problem # 1:  * COMBATIVE BEHAVIOR / HOSPITAL / HALDOL USED In the hospital he had some combative behavior.  In general he is very nice gentleman.  His behavior has edema to stress of his being in the hospital.  Haldol did help. It is of note that his ammonia in the hospital was 33 which is high normal.  Problem # 2:  CHRONIC SYSTOLIC HEART FAILURE (ICD-428.22) The patient had to be diuresis in the hospital.  His overall volume status today is stable by his home weight of 247.  However he feels a little bloated today.  It is possible that his home dry weight needs to be slightly lower.  Chemistry rechecked today.  He will be given one dose of Zaroxolyn 2.5 mg to be taken with his next dose of Lasix.  We'll continue to talk with him at home daily about his weight. The patient presented with shortness of breath and decreased appetite during this hospitalization.  Problem # 3:  RENAL INSUFFICIENCY, CHRONIC (ICD-585.9) As outlined in the history of present illness, patient went home with a BUN of 30 and creatinine 1.67.  Chemistry rechecked today.  Problem # 4:  HYPERTENSION (ICD-401.9) Blood pressure control today.  No change in therapy.  Problem # 5:  HYPOKALEMIA (ICD-276.8) The patient's potassium is watched very carefully.  He will have a chemistry lab done today.  Problem # 6:  CAD (ICD-414.00) The patient has known coronary disease.  He underwent catheterization April 07, 2009.  This  was unchanged from 2006.  There was no intervention indicated.  During hospitalization in January, 2011, patient did have a rise in his troponin.  He may have had a non-STEMI , Type II related to the stress of his congestive heart failure.  I chose not to consider repeat As he had recently undergone catheterization.  Problem # 7:  EDEMA (ICD-782.3) The patient has mild chronic edema in his left ankle.  When he has edema in his right ankle and more in his left renal he is volume overloaded.  Problem # 8:  * ICD The patient's ICD had been placed in the past several months.  The site looks good and he is doing well.  Patient Instructions: 1)  Labs today 2)  Take Zaroxolyn 2.5mg  today with your evening dose of Lasix. 3)  Call in the morning with your weight, [phone] Heather 4)  Follow up in 1-2 weeks Prescriptions: METOLAZONE 2.5 MG TABS (METOLAZONE) Take one tablet by mouth daily as directed  #5 x 3   Entered by:   Meredith Staggers, RN   Authorized by:   Talitha Givens, MD, [facility]   Signed by:   Meredith Staggers, RN on 06/16/2009   Method used:   Electronically to        [facility]  [address] (retail)       [address]       [place], [place]  [address]       Ph: [phone]       Fax: [phone]   RxID:   [id number]

## 2010-06-11 NOTE — Cardiovascular Report (Signed)
Summary: Office Visit   Office Visit   Imported By: Roderic Ovens 12/09/2009 15:49:46  _____________________________________________________________________  External Attachment:    Type:   Image     Comment:   External Document

## 2010-06-11 NOTE — Progress Notes (Signed)
Summary: Request call about weight gain   Phone Note Call from Patient   Caller: Patient Summary of Call: Pt request call about weight gain Initial call taken by: Judie Grieve,  August 26, 2009 8:30 AM  Follow-up for Phone Call        wt up to 235 today up 3 lbs from yest, little sob today no edema is currently on lasix 80mg  in am and 40mg  pm will take 80mg  this pm and call tom w/update [name] Staggers, RN  August 26, 2009 9:07 AM

## 2010-06-11 NOTE — Assessment & Plan Note (Signed)
Summary: f2w      Allergies Added:   Visit Type:  Follow-up Referring Provider:  Zenovia Jordan, MD Primary Provider:  Jacques Navy MD  CC:  dizziness.  History of Present Illness: The patient is seen for followup of coronary disease and left ventricular dysfunction and dizzy spell.  I saw him last on August 23, 011 at that time I felt that he was still having a problem with hypotension and orthostasis.  I've actually considered using Benadryl in the morning.  He has been holding several of his meds in the morning and he now definitely feels better in the morning.  He's had a few dizzy spells in the evening now.  His weight he is climbing slightly related to volume.  Current Medications (verified): 1)  Carvedilol 6.25 Mg Tabs (Carvedilol) .... Take One Tablet By Mouth Twice A Day 2)  Furosemide 40 Mg Tabs (Furosemide) .... Take 1 Tablet Two Times A Day Extra As Needed 3)  Klor-Con M20 20 Meq  Tbcr (Potassium Chloride Crys Cr) .Marland Kitchen.. 1 By Mouth Two Times A Day 4)  Prilosec Otc 20 Mg Tbec (Omeprazole Magnesium) .... Take One Tablet By Mouth Once Daily. 5)  Aspirin Ec 325 Mg Tbec (Aspirin) .... Take One Tablet By Mouth Daily 6)  Allopurinol 300 Mg Tabs (Allopurinol) .... Take One Tablet By Mouth Once Daily. 7)  Restasis 0.05 %  Emul (Cyclosporine) .... One Drop Each Eye Two Times A Day 8)  Ocuvite Preservision  Tabs (Multiple Vitamins-Minerals) .Marland Kitchen.. 1 Two Times A Day 9)  Flomax 0.4 Mg Xr24h-Cap (Tamsulosin Hcl) .... Take One Tablet By Mouth Once Daily. 10)  Nasal Moisturizing Spray 0.65 % Soln (Saline) .... Prn 11)  Alprazolam 0.25 Mg Tabs (Alprazolam) .... As Needed 12)  Plavix 75 Mg Tabs (Clopidogrel Bisulfate) .Marland Kitchen.. 1 By Mouth Daily 13)  Nitrostat 0.4 Mg Subl (Nitroglycerin) .... As Needed 14)  Acetaminophen 325 Mg  Tabs (Acetaminophen) .... As Needed 15)  Clonidine Hcl 0.1 Mg Tabs (Clonidine Hcl) .Marland Kitchen.. 1 Tab Every Evening, Hold Am Dose 16)  Isosorbide Dinitrate Cr 40 Mg Cr-Tabs  (Isosorbide Dinitrate) .... Take One Capsule Every Evening, Hold Am Dose 17)  Hydralazine Hcl 100 Mg Tabs (Hydralazine Hcl) .... 3/4 Tablet Every Evening, Hold Am Dose 18)  Gabapentin 300 Mg Caps (Gabapentin) .... Twice  Times A Day 19)  Metolazone 2.5 Mg Tabs (Metolazone) .... Take One Tablet By Mouth Daily As Directed 20)  Colcrys 0.6 Mg Tabs (Colchicine) .Marland Kitchen.. 1 Tab Three Times A Day As Needed 21)  Multivitamins   Tabs (Multiple Vitamin) .... Once Daily 22)  Mucinex D 60-600 Mg Xr12h-Tab (Pseudoephedrine-Guaifenesin) .... Daily As Needed Fofr Thick, Sticky Mucus 23)  Antivert 25 Mg Tabs (Meclizine Hcl) .Marland Kitchen.. 1 By Mouth Every 4 Hours As Needed For Dizzy Symptoms 24)  Fluticasone Propionate 50 Mcg/act Susp (Fluticasone Propionate) .... 2 Spray/side Once Daily 25)  Tramadol Hcl 50 Mg Tabs (Tramadol Hcl) .Marland Kitchen.. 1-2 By Mouth Q 6 Hrs As Needed 26)  Bethanechol Chloride 25 Mg Tabs (Bethanechol Chloride) .... Three Times A Day 27)  Methotrexate 2.5 Mg Tabs (Methotrexate Sodium) .... Uad 28)  Antivert 25 Mg Tabs (Meclizine Hcl) .... Uad  Allergies (verified): 1)  ! Cortisone 2)  ! Augmentin 3)  ! Nsaids  Past History:  Past Medical History: UCD Polio -70 y/o  CABG 7 Coronary artery disease..catheterization 2006 new occlusion vein graft  / myoview..01/2006..inferior scar ..mild peri-infarct ischemia  /  non-STEMI April 03, 2009...Marland KitchenMarland Kitchen catheterization  April 07, 2009... severe native disease... LIMA to the LAD patent, SVG to OM patent but sequential grafts to the posterior lateral of the OM and then the posterior lateral of the right occluded, SVG to diagonal occluded SVG to the right coronary artery occluded.... this was unchanged since 2006...  /   hospitalization...1.2011.Marland Kitchen elevated troponin/CHF.Marland Kitchen no work-up ischemic cardiomyopathy ICD  placed  05/12/2009 EF 40%  echo..01/2006..inferior/posterior akinesis /  30% echo... June, 2010... technically limited study A/C systolic CHF.[facility]  05/31/2009..diuresed Combative in hospital  05/2009...treated  haldol... Carotid aretery disease...doppler..04/2007.Marland Kitchen0-39% RICA.Marland Kitchen60-79%  LICA  /   Doppler... March 21, 2009... 0-39% R. ICA, 60-79% LICA... unchanged he is no rebound Abdominal bruit but no abdominal aortic aneurysm and no renal artery stenosis Grief over wife having severe Alzheimer's bradycardia mild not symptomatic Gout.Marland Kitchenallopurinol and p.r.n. colchicine Hypertension Nephrolithiasis, hx of Prostatitis, hx of  Hyperlipidemia.Marland Kitchenintolerant to all cholesterol medications Cholelithiasis Chronic kidney disease.. Dr. 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 removed at [facility] in the past Aortic root dilitation...mild.. Mild musculoskeletal back pain Renal and liver cysts on CT scan Chronic mild dizziness   worse.. July, 2011 Augmentin allergy. swelling  tongue Renal insufficiency...  Orthostatic dizziness           Cardiology - Myrtis Ser          GU - Darvin Neighbours          Derm - Dr. Mayford Knife in [place]          Vasc Surgeon - Madilyn Fireman          GI - Yancey Flemings         GS - Gross  Review of Systems       Patient denies fever, chills, headache, sweats, rash, change in vision, change in hearing, chest pain, cough, nausea vomiting, urinary symptoms.  All the systems are reviewed and are negative  Vital Signs:  Patient profile:   70 year old male Height:      71 inches Weight:      246 pounds BMI:     34.43 Pulse rate:   75 / minute BP sitting:   144 / 80  (right arm) Cuff size:   large  Vitals Entered By: Hardin Negus, RMA (January 13, 2010 3:44 PM)  Physical Exam  General:  patient is stable today. Eyes:  no xanthelasma. Neck:  no jugular venous distention. Lungs:  lungs are clear.  Respiratory effort is nonlabored. Heart:  cardiac exam reveals S1-S2.  No clicks or significant murmurs. Abdomen:  abdomen is soft. Extremities:  patient does have  1+ edema today Psych:  patient is oriented to person time and place.  Affect is normal.    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  He had been taking his clonidine only at night.  We will now stop it completely.  He is not at risk of withdrawal as he is on a very small dose.  PT will be to watch his volume status if we allow his blood pressure to drift higher  Problem # 2:  RENAL INSUFFICIENCY (ICD-588.9) His renal function was checked recently and his creatinine was stable at 1.7.  He does not leave labs today.  Problem # 3:  EDEMA (ICD-782.3) It is very important that he watches his fluid status.  If his weight climbs one additional pounds he will take extra diuretics.  Patient Instructions: 1)  Stop Clonidine 2)  If weight increases you may take an extra Furosemide 3)  Follow up in 2 weeks:  Monday 9/26 at 12:00

## 2010-06-11 NOTE — Letter (Signed)
Summary: [facility]   Imported By: Marylou Mccoy 09/30/2009 13:53:22  _____________________________________________________________________  External Attachment:    Type:   Image     Comment:   External Document

## 2010-06-11 NOTE — Assessment & Plan Note (Signed)
Summary: pc2/jml  Medications Added FUROSEMIDE 40 MG TABS (FUROSEMIDE) Take 2 tablets in am 1 tab in pm ASPIRIN EC 325 MG TBEC (ASPIRIN) Take one tablet by mouth daily ISOSORBIDE DINITRATE 10 MG TABS (ISOSORBIDE DINITRATE) Take one capsule by mouth three times a day MULTIVITAMINS   TABS (MULTIPLE VITAMIN) once daily        Visit Type:  Follow-up Referring Provider:  Zenovia Jordan, MD Primary Provider:  Jacques Navy MD   History of Present Illness: The patient is a 70 year old male with longstanding ischemic cardiomyopathy, severe LV dysfunction, nonsustained ventricular tachycardia, and class II congestive heart failure.  The patient appears to be doing well.  He denies c/p, sob, or peripheral edema.  No intercurrent ICD therapies.  Current Medications (verified): 1)  Carvedilol 6.25 Mg Tabs (Carvedilol) .... Take One Tablet By Mouth Twice A Day 2)  Furosemide 40 Mg Tabs (Furosemide) .... Take 2 Tablets in Am 1 Tab in Pm 3)  Klor-Con M20 20 Meq  Tbcr (Potassium Chloride Crys Cr) .Marland Kitchen.. 1 By Mouth Two Times A Day 4)  Prilosec Otc 20 Mg Tbec (Omeprazole Magnesium) .... Take One Tablet By Mouth Once Daily. 5)  Aspirin Ec 325 Mg Tbec (Aspirin) .... Take One Tablet By Mouth Daily 6)  Bethanechol Chloride 25 Mg Tabs (Bethanechol Chloride) .... Three Times A Day 7)  Allopurinol 300 Mg Tabs (Allopurinol) .... Take One Tablet By Mouth Once Daily. 8)  Restasis 0.05 %  Emul (Cyclosporine) .... One Drop Each Eye Two Times A Day 9)  Ocuvite Preservision  Tabs (Multiple Vitamins-Minerals) .Marland Kitchen.. 1 Two Times A Day 10)  Flomax 0.4 Mg Xr24h-Cap (Tamsulosin Hcl) .... Take One Tablet By Mouth Once Daily. 11)  Nasal Moisturizing Spray 0.65 % Soln (Saline) .... Prn 12)  Alprazolam 0.25 Mg Tabs (Alprazolam) .... As Needed 13)  Plavix 75 Mg Tabs (Clopidogrel Bisulfate) .Marland Kitchen.. 1 By Mouth Daily 14)  Nitrostat 0.4 Mg Subl (Nitroglycerin) .... As Needed 15)  Acetaminophen 325 Mg  Tabs (Acetaminophen) .... As  Needed 16)  Clonidine Hcl 0.1 Mg Tabs (Clonidine Hcl) .... One By Mouth Three Times A Day 17)  Isosorbide Mononitrate Cr 60 Mg Xr24h-Tab (Isosorbide Mononitrate) .... One By Mouth Three Times A Day 18)  Hydralazine Hcl 100 Mg Tabs (Hydralazine Hcl) .... 3/4 Tablet Three Times A Day 19)  Gabapentin 300 Mg Caps (Gabapentin) .... Three Times A Day 20)  Metolazone 2.5 Mg Tabs (Metolazone) .... Take One Tablet By Mouth Daily As Directed 21)  Colcrys 0.6 Mg Tabs (Colchicine) .Marland Kitchen.. 1 Tab Three Times A Day As Needed 22)  Isosorbide Dinitrate 10 Mg Tabs (Isosorbide Dinitrate) .... Take One Capsule By Mouth Three Times A Day 23)  Multivitamins   Tabs (Multiple Vitamin) .... Once Daily  Allergies: 1)  ! Cortisone 2)  ! Augmentin 3)  ! Nsaids  Past History:  Past Medical History: Last updated: 06/16/2009 UCD Polio -70 y/o CABG 75 Coronary artery disease..catheterization 2006 new occlusion vein graft  / myoview..01/2006..inferior scar ..mild peri-infarct ischemia  /  non-STEMI April 03, 2009...Marland KitchenMarland Kitchen catheterization April 07, 2009... severe native disease... LIMA to the LAD patent, SVG to OM patent but sequential grafts to the posterior lateral of the OM and then the posterior lateral of the right occluded, SVG to diagonal occluded SVG to the right coronary artery occluded.... this was unchanged since 2006...  /   hospitalization...1.2011.Marland Kitchen elevated troponin/CHF.Marland Kitchen no work-up ischemic cardiomyopathy ICD  placed  05/12/2009 EF 40%  echo..01/2006..inferior/posterior akinesis /  30% echo... June, 2010... technically limited study A/C systolic CHF.[facility]..diuresed Combative in hospital  05/2009...treated  haldol... Carotid aretery disease...doppler..04/2007.Marland Kitchen0-39% RICA.Marland Kitchen60-79%  LICA  /   Doppler... March 21, 2009... 0-39% R. ICA, 60-79% LICA... unchanged he is no rebound Abdominal bruit but no abdominal aortic aneurysm and no renal artery stenosis Grief over wife having severe  Alzheimer's bradycardia mild not symptomatic Gout.Marland Kitchenallopurinol and p.r.n. colchicine Hypertension Nephrolithiasis, hx of Prostatitis, hx of  Hyperlipidemia.Marland Kitchenintolerant to all cholesterol medications Cholelithiasis Chronic kidney disease.. Dr. 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 removed at [facility] in the past Aortic root dilitation...mild.. Mild musculoskeletal back pain Renal and liver cysts on CT scan Chronic mild dizziness Augmentin allergy with swelling of the tongue Renal insufficiency... function varies  volume overloaded.  June 03, 2009... hospital discharge... BUN 30 / creatinine 1.67           Cardiology - Myrtis Ser          GU - Darvin Neighbours          Derm - Dr. Mayford Knife in [place]          Vasc Surgeon - Madilyn Fireman          GI - Yancey Flemings         GS - Gross  Past Surgical History: Last updated: 05/29/2007 arthoroscopy-right knee nephrolithiasis surgery - August '87 brain tumor - excision August '99 melanoma excision from back - June '82 Dr. Pixie Casino at [facility] Coronary artery bypass graft- LIMA to LAD; SVG to dx, OM, Cx, RCA  '93 Lap Chole  October '08  Review of Systems  The patient denies chest pain, syncope, dyspnea on exertion, and peripheral edema.    Vital Signs:  Patient profile:   70 year old male Height:      71 inches Weight:      239 pounds BMI:     33.45 O2 Sat:      97 % Pulse rate:   51 / minute BP sitting:   122 / 84  (left arm)  Vitals Entered By: Laurance Flatten CMA (August 12, 2009 8:46 AM)  Physical Exam  General:  patient is stable in general. Head:  head is atraumatic. Eyes:  no xanthelasma. Neck:  no jugular venous distention. Chest Wall:  no chest wall tenderness. Well healed ICD incision. Lungs:  lungs are clear respiratory effort is nonlabored. Heart:  cardiac exam reveals S1-S2.  No clicks or significant murmurs. Abdomen:  abdomen is soft. Msk:  There is mild decrease  range of motion of his left lower extremity.  The patient has 4/5 muscle strength in his right upper extremity and left upper extremity.  There is 4/5 muscle strength in his right lower extremity and 3/5 strength in his left lower extremity.  With walking he has mild imbalance.  He needs his walker to help stabilize. Pulses:  normal DP pulse right foot. Extremities:  no peripheral edema today. Neurologic:  The patient is oriented to person time and place.  Affect is normal.  Cranial nerves are normal.  Reflexes reveal mild decrease in reflex at his left knee.  He has no major sensory difficulties.  Muscle strength as described above.    ICD Specifications Following MD:  Lewayne Bunting, MD     ICD Vendor:  [facility] Jude     ICD Model Number:  [id number]     ICD Serial Number:  [id number] ICD DOI:  05/12/2009  ICD Implanting MD:  Lewayne Bunting, MD  Lead 1:    Location: RV     DOI: 05/12/2009     Model #: [id number]     Serial #: [id number]     Status: active  ICD Follow Up Remote Check?  No Charge Time:  8.6 seconds     Battery Est. Longevity:  8.2 years ICD Dependent:  No       ICD Device Measurements Right Ventricle:  Amplitude: 12 mV, Impedance: 540 ohms, Threshold: 0.5 V at 0.5 msec Shock Impedance: 42 ohms   Episodes Coumadin:  No Shock:  0     ATP:  0     Nonsustained:  0      Brady Parameters Mode VVI     Lower Rate Limit:  40      Tachy Zones VF:  240     VT:  200     VT1:  173     Next Remote Date:  11/10/2009     Next Cardiology Appt Due:  05/10/2010 Tech Comments:  No parameter changes.  Merlin transmissions every 3 months.  ROV 1/11. Checked by [employer]. Altha Harm, LPN  August 13, 1322 9:06 AM  MD Comments:  Agree with above.  Impression & Recommendations:  Problem # 1:  AUTOMATIC IMPLANTABLE CARDIAC DEFIBRILLATOR SITU (ICD-V45.02) His device is working normally.  I will see him back in one year.  Problem # 2:  CHRONIC SYSTOLIC HEART FAILURE (ICD-428.22) His CHF is well compensated.  A  low sodium diet and continued medical therapy is recommended. His updated medication list for this problem includes:    Carvedilol 6.25 Mg Tabs (Carvedilol) .Marland Kitchen... Take one tablet by mouth twice a day    Furosemide 40 Mg Tabs (Furosemide) .Marland Kitchen... Take 2 tablets in am 1 tab in pm    Aspirin Ec 325 Mg Tbec (Aspirin) .Marland Kitchen... Take one tablet by mouth daily    Plavix 75 Mg Tabs (Clopidogrel bisulfate) .Marland Kitchen... 1 by mouth daily    Nitrostat 0.4 Mg Subl (Nitroglycerin) .Marland Kitchen... As needed    Isosorbide Mononitrate Cr 60 Mg Xr24h-tab (Isosorbide mononitrate) ..... One by mouth three times a day    Metolazone 2.5 Mg Tabs (Metolazone) .Marland Kitchen... Take one tablet by mouth daily as directed    Isosorbide Dinitrate 10 Mg Tabs (Isosorbide dinitrate) .Marland Kitchen... Take one capsule by mouth three times a day  His updated medication list for this problem includes:    Carvedilol 6.25 Mg Tabs (Carvedilol) .Marland Kitchen... Take one tablet by mouth twice a day    Furosemide 40 Mg Tabs (Furosemide) .Marland Kitchen... Take 2 tablets in am 1 tab in pm    Aspirin Ec 325 Mg Tbec (Aspirin) .Marland Kitchen... Take one tablet by mouth daily    Plavix 75 Mg Tabs (Clopidogrel bisulfate) .Marland Kitchen... 1 by mouth daily    Nitrostat 0.4 Mg Subl (Nitroglycerin) .Marland Kitchen... As needed    Isosorbide Mononitrate Cr 60 Mg Xr24h-tab (Isosorbide mononitrate) ..... One by mouth three times a day    Metolazone 2.5 Mg Tabs (Metolazone) .Marland Kitchen... Take one tablet by mouth daily as directed    Isosorbide Dinitrate 10 Mg Tabs (Isosorbide dinitrate) .Marland Kitchen... Take one capsule by mouth three times a day  Problem # 3:  CAD (ICD-414.00) He denies anginal symptoms.  Continue current meds. His updated medication list for this problem includes:    Carvedilol 6.25 Mg Tabs (Carvedilol) .Marland Kitchen... Take one tablet by mouth twice a day    Aspirin Ec 325 Mg Tbec (  Aspirin) .Marland Kitchen... Take one tablet by mouth daily    Plavix 75 Mg Tabs (Clopidogrel bisulfate) .Marland Kitchen... 1 by mouth daily    Nitrostat 0.4 Mg Subl (Nitroglycerin) .Marland Kitchen... As needed     Isosorbide Mononitrate Cr 60 Mg Xr24h-tab (Isosorbide mononitrate) ..... One by mouth three times a day    Isosorbide Dinitrate 10 Mg Tabs (Isosorbide dinitrate) .Marland Kitchen... Take one capsule by mouth three times a day  Patient Instructions: 1)  Your physician recommends that you schedule a follow-up appointment in: January 2011

## 2010-06-11 NOTE — Cardiovascular Report (Signed)
Summary: Pre Op Orders  Pre Op Orders   Imported By: Roderic Ovens 05/21/2009 13:06:49  _____________________________________________________________________  External Attachment:    Type:   Image     Comment:   External Document

## 2010-06-11 NOTE — Procedures (Signed)
Summary: wound check  Medications Added * ASPIRIN 81 MG  TABS (ASPIRIN) 1 by mouth daily CLONIDINE HCL 0.1 MG TABS (CLONIDINE HCL) one by mouth three times a day ISOSORBIDE MONONITRATE CR 60 MG XR24H-TAB (ISOSORBIDE MONONITRATE) one by mouth three times a day HYDRALAZINE HCL 100 MG TABS (HYDRALAZINE HCL) one half by mouth daily        Current Medications (verified): 1)  Carvedilol 6.25 Mg Tabs (Carvedilol) .... Take One Tablet By Mouth Twice A Day 2)  Furosemide 80 Mg Tabs (Furosemide) .... Take One Tablet By Mouth Two Times A Day 3)  Klor-Con M20 20 Meq  Tbcr (Potassium Chloride Crys Cr) .Marland Kitchen.. 1 By Mouth Two Times A Day 4)  Prilosec Otc 20 Mg Tbec (Omeprazole Magnesium) .... Take One Tablet By Mouth Once Daily. 5)  Aspirin 81 Mg  Tabs (Aspirin) .Marland Kitchen.. 1 By Mouth Daily 6)  Bethanechol Chloride 25 Mg Tabs (Bethanechol Chloride) .... Three Times A Day 7)  Allopurinol 300 Mg Tabs (Allopurinol) .... Take One Tablet By Mouth Once Daily. 8)  Restasis 0.05 %  Emul (Cyclosporine) .... One Drop Each Eye Two Times A Day 9)  Ocuvite Preservision  Tabs (Multiple Vitamins-Minerals) .Marland Kitchen.. 1 Two Times A Day 10)  Flomax 0.4 Mg Xr24h-Cap (Tamsulosin Hcl) .... Take One Tablet By Mouth Once Daily. 11)  Nasal Moisturizing Spray 0.65 % Soln (Saline) .... Prn 12)  Alprazolam 0.25 Mg Tabs (Alprazolam) .... As Needed 13)  Plavix 75 Mg Tabs (Clopidogrel Bisulfate) .Marland Kitchen.. 1 By Mouth Daily 14)  Nitrostat 0.4 Mg Subl (Nitroglycerin) .... As Needed 15)  Acetaminophen 325 Mg  Tabs (Acetaminophen) .... As Needed 16)  Clonidine Hcl 0.1 Mg Tabs (Clonidine Hcl) .... One By Mouth Three Times A Day 17)  Isosorbide Mononitrate Cr 60 Mg Xr24h-Tab (Isosorbide Mononitrate) .... One By Mouth Three Times A Day 18)  Hydralazine Hcl 100 Mg Tabs (Hydralazine Hcl) .... One Half By Mouth Daily  Allergies: 1)  ! Cortisone 2)  ! Augmentin 3)  ! Nsaids   ICD Specifications Following MD:  [name] Bunting, MD     ICD Vendor:  [employer]      ICD Model Number:  [id number]     ICD Serial Number:  [id number] ICD DOI:  05/12/2009     ICD Implanting MD:  [name] Bunting, MD  Lead 1:    Location: RV     DOI: 05/12/2009     Model #: [id number]     Serial #: [id number]     Status: active  ICD Follow Up Remote Check?  No Charge Time:  8.6 seconds     Battery Est. Longevity:  8.1 years Underlying rhythm:  SR ICD Dependent:  No       ICD Device Measurements Right Ventricle:  Amplitude: 12 mV, Impedance: 480 ohms, Threshold: 0.5 V at 0.5 msec Shock Impedance: 35 ohms   Episodes Coumadin:  No Shock:  0     ATP:  0     Nonsustained:  0     Ventricular Pacing:  2.2%  Brady Parameters Mode VVI     Lower Rate Limit:  40      Tachy Zones VF:  240     VT:  200     VT1:  173     Next Cardiology Appt Due:  08/08/2009 Tech Comments:  Steri-striips removed, no redness but a moderate amount of edema noted.  [name] states that the site edema is lessening.  He also thought his  device had discharged the other night but there are no episodes recorded.  I instructed him on procedure if the device does fire.  No parameter changes.  He will be seen back in 3 months with Dr. Ladona Ridgel. Altha Harm, LPN  May 28, 2009 12:05 PM  MD Comments:  Agree with above.

## 2010-06-11 NOTE — Assessment & Plan Note (Signed)
Summary: f2w  Medications Added GABAPENTIN 300 MG CAPS (GABAPENTIN) Take 1 capsule by mouth three times a day      Allergies Added:   Visit Type:  Follow-up Referring Provider:  Zenovia Jordan, MD Primary Provider:  Jacques Navy MD  CC:  dizzy spells.  History of Present Illness: The patient is seen for followup of dizzy spells.  We know that there is an orthostatic component.  We have taken him off his morning medications and he takes his medicines only later in the day.  I have considered the use of midodrine in the morning.  Since his last visit he has had some brief spells but no true syncope.  He mentions that he gets an unusual sensation in his left leg frequently when he has the spells.  Etiology is not clear.  He has received gabapentin over time.  It is possible this may help him.  Current Medications (verified): 1)  Carvedilol 6.25 Mg Tabs (Carvedilol) .... Take One Tablet By Mouth Twice A Day 2)  Furosemide 40 Mg Tabs (Furosemide) .... Take 1 Tablet Two Times A Day Extra As Needed 3)  Klor-Con M20 20 Meq  Tbcr (Potassium Chloride Crys Cr) .Marland Kitchen.. 1 By Mouth Two Times A Day 4)  Prilosec Otc 20 Mg Tbec (Omeprazole Magnesium) .... Take One Tablet By Mouth Once Daily. 5)  Aspirin Ec 325 Mg Tbec (Aspirin) .... Take One Tablet By Mouth Daily 6)  Allopurinol 300 Mg Tabs (Allopurinol) .... Take One Tablet By Mouth Once Daily. 7)  Restasis 0.05 %  Emul (Cyclosporine) .... One Drop Each Eye Two Times A Day 8)  Ocuvite Preservision  Tabs (Multiple Vitamins-Minerals) .Marland Kitchen.. 1 Two Times A Day 9)  Flomax 0.4 Mg Xr24h-Cap (Tamsulosin Hcl) .... Take One Tablet By Mouth Once Daily. 10)  Nasal Moisturizing Spray 0.65 % Soln (Saline) .... Prn 11)  Alprazolam 0.25 Mg Tabs (Alprazolam) .... As Needed 12)  Plavix 75 Mg Tabs (Clopidogrel Bisulfate) .Marland Kitchen.. 1 By Mouth Daily 13)  Nitrostat 0.4 Mg Subl (Nitroglycerin) .... As Needed 14)  Acetaminophen 325 Mg  Tabs (Acetaminophen) .... As Needed 15)   Isosorbide Dinitrate Cr 40 Mg Cr-Tabs (Isosorbide Dinitrate) .... Take One Capsule Every Evening, Hold Am Dose 16)  Hydralazine Hcl 100 Mg Tabs (Hydralazine Hcl) .... 3/4 Tablet Every Evening, Hold Am Dose 17)  Gabapentin 300 Mg Caps (Gabapentin) .... Twice  Times A Day 18)  Metolazone 2.5 Mg Tabs (Metolazone) .... Take One Tablet By Mouth Daily As Directed 19)  Colcrys 0.6 Mg Tabs (Colchicine) .Marland Kitchen.. 1 Tab Three Times A Day As Needed 20)  Multivitamins   Tabs (Multiple Vitamin) .... Once Daily 21)  Mucinex D 60-600 Mg Xr12h-Tab (Pseudoephedrine-Guaifenesin) .... Daily As Needed Fofr Thick, Sticky Mucus 22)  Antivert 25 Mg Tabs (Meclizine Hcl) .Marland Kitchen.. 1 By Mouth Every 4 Hours As Needed For Dizzy Symptoms 23)  Fluticasone Propionate 50 Mcg/act Susp (Fluticasone Propionate) .... 2 Spray/side Once Daily 24)  Tramadol Hcl 50 Mg Tabs (Tramadol Hcl) .Marland Kitchen.. 1-2 By Mouth Q 6 Hrs As Needed 25)  Bethanechol Chloride 25 Mg Tabs (Bethanechol Chloride) .... Three Times A Day 26)  Methotrexate 2.5 Mg Tabs (Methotrexate Sodium) .... Uad 27)  Antivert 25 Mg Tabs (Meclizine Hcl) .... Uad  Allergies (verified): 1)  ! Cortisone 2)  ! Augmentin 3)  ! Nsaids  Past History:  Past Medical History: UCD Polio -70 y/o  CABG 76 Coronary artery disease..catheterization 2006 new occlusion vein graft  / myoview..01/2006..inferior scar .Marland Kitchen  mild peri-infarct ischemia  /  non-STEMI April 03, 2009...Marland KitchenMarland Kitchen catheterization April 07, 2009... severe native disease... LIMA to the LAD patent, SVG to OM patent but sequential grafts to the posterior lateral of the OM and then the posterior lateral of the right occluded, SVG to diagonal occluded SVG to the right coronary artery occluded.... this was unchanged since 2006...  /   hospitalization...1.2011.Marland Kitchen elevated troponin/CHF.Marland Kitchen no work-up ischemic cardiomyopathy ICD  placed  05/12/2009 EF 40%  echo..01/2006..inferior/posterior akinesis /  30% echo... June, 2010... technically limited  study A/C systolic CHF.[facility]..diuresed Combative in hospital  05/2009...treated  haldol... Carotid aretery disease...doppler..04/2007.Marland Kitchen0-39% RICA.Marland Kitchen60-79%  LICA  /   Doppler... March 21, 2009... 0-39% R. ICA, 60-79% LICA... unchanged he is no rebound Abdominal bruit but no abdominal aortic aneurysm and no renal artery stenosis Grief over wife having severe Alzheimer's bradycardia mild not symptomatic Gout.Marland Kitchenallopurinol and p.r.n. colchicine Hypertension Nephrolithiasis, hx of Prostatitis, hx of  Hyperlipidemia.Marland Kitchenintolerant to all cholesterol medications Cholelithiasis Chronic kidney disease.. Dr. 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 removed at [facility] in the past Aortic root dilitation...mild.. Mild musculoskeletal back pain Renal and liver cysts on CT scan Chronic mild dizziness   worse.. July, 2011 Augmentin allergy. swelling  tongue Renal insufficiency...  Orthostatic dizziness           Cardiology - Myrtis Ser          GU - Darvin Neighbours          Derm - Dr. Mayford Knife in [place]          Vasc Surgeon - Madilyn Fireman          GI - Yancey Flemings         GS - Gross  Review of Systems       Patient denies fever, chills, headache, sweats, rash no change in vision, change in hearing, chest pain, coug,,h nausea vomiting or urinary symptoms.   All other systems are reviewed and are negative.  Vital Signs:  Patient profile:   70 year old male Height:      71 inches Weight:      245 pounds BMI:     34.29 Pulse rate:   75 / minute BP sitting:   134 / 72  (left arm) Cuff size:   large  Vitals Entered By: Hardin Negus, RMA (February 02, 2010 11:40 AM)  Physical Exam  General:  he is stable today. Eyes:  no xanthelasma. Neck:  no jugular venous distention. Lungs:  lungs are clear respiratory effort is nonlabored. Heart:  cardiac exam reveals S1 and S2.  There is a soft systolic murmur. Abdomen:  abdomen is  soft. Extremities:  no peripheral edema. Psych:  patient is oriented to person time and place.  Affect is normal.    ICD Specifications Following MD:  Lewayne Bunting, MD     ICD Vendor:  [employer]     ICD Model Number:  [id number]     ICD Serial Number:  [id number] ICD DOI:  05/12/2009     ICD Implanting MD:  Lewayne Bunting, MD  Lead 1:    Location: RV     DOI: 05/12/2009     Model #: 8469     Serial #: [id number]     Status: active  ICD Follow Up ICD Dependent:  No      Episodes Coumadin:  No  Brady Parameters Mode VVI     Lower Rate Limit:  40  Tachy Zones VF:  240     VT:  200     VT1:  173     Impression & Recommendations:  Problem # 1:  DIZZINESS (ICD-780.4) I continued to try to figure out the etiology of his spells.  I wonder now if this is partially a neurologic problem.  We will increase his gabapentin dose from 300 b.i.d. to 300 t.i.d.  I'll see him for follow.  Problem # 2:  EDEMA (ICD-782.3) Edema is controlled today.  No change in therapy.  Problem # 3:  CAD (ICD-414.00)  His updated medication list for this problem includes:    Carvedilol 6.25 Mg Tabs (Carvedilol) .Marland Kitchen... Take one tablet by mouth twice a day    Aspirin Ec 325 Mg Tbec (Aspirin) .Marland Kitchen... Take one tablet by mouth daily    Plavix 75 Mg Tabs (Clopidogrel bisulfate) .Marland Kitchen... 1 by mouth daily    Nitrostat 0.4 Mg Subl (Nitroglycerin) .Marland Kitchen... As needed    Isosorbide Dinitrate Cr 40 Mg Cr-tabs (Isosorbide dinitrate) .Marland Kitchen... Take one capsule every evening, hold am dose Coronary disease is stable.  No change in therapy  Patient Instructions: 1)  Follow up in 4 weeks--Thursday Oct 27th at 10am Prescriptions: GABAPENTIN 300 MG CAPS (GABAPENTIN) Take 1 capsule by mouth three times a day  #90 x 6   Entered by:   Meredith Staggers, RN   Authorized by:   Talitha Givens, MD, [facility]   Signed by:   Meredith Staggers, RN on 02/02/2010   Method used:   Electronically to        [facility]  [address]* (retail)       [address]       [place], [place]  [address]       Ph: [phone]       Fax: [phone]   RxID:   [id number] GABAPENTIN 300 MG CAPS (GABAPENTIN) Take 1 capsule by mouth three times a day  #90 x 6   Entered by:   Meredith Staggers, RN   Authorized by:   Talitha Givens, MD, [facility]   Signed by:   Meredith Staggers, RN on 02/02/2010   Method used:   Electronically to        [facility]* (retail)       [address]       [place], [place]  [address]       Ph: [phone]       Fax: [phone]   RxID:   [id number]

## 2010-06-11 NOTE — Letter (Signed)
SummaryDeboraha [name] Physician Progress Note  [facility] Progress Note   Imported By: Roderic Ovens 06/17/2009 13:47:04  _____________________________________________________________________  External Attachment:    Type:   Image     Comment:   External Document

## 2010-06-11 NOTE — Miscellaneous (Signed)
Summary: remove med from list  pt called and stated he was not on methotrexate and doesn't remember ever being on med, he called his pharmacy and they don't have a record of him having a prescription for this med, med removed from list Meredith Staggers, RN  February 17, 2010 2:56 PM  Clinical Lists Changes  Medications: Removed medication of METHOTREXATE 2.5 MG TABS (METHOTREXATE SODIUM) uad

## 2010-06-11 NOTE — Progress Notes (Signed)
Summary: Warner Mccreedy ON PAPER WORK   Phone Note Other Incoming Call back at [phone]   Caller: HOVERROUND/ANGEL Summary of Call: CHECKING ON PAPER WORK. Initial call taken by: Judie Grieve,  July 15, 2009 12:32 PM  Follow-up for Phone Call        they were checking to see if we had received PT results yet, she states PT eval was completed last Tue and we should be receiving info shortly Meredith Staggers, RN  July 15, 2009 2:44 PM

## 2010-06-11 NOTE — Assessment & Plan Note (Signed)
Summary: 4wk f/u /sl      Allergies Added:   Visit Type:  Follow-up Referring Provider:  Zenovia Jordan, MD Primary Provider:  Jacques Navy MD  CC:  CHF.  History of Present Illness: The patient is here to followup CHF with volume overload.  We have been in touch with him at home concerning his weight.  He has a weight variation his diuretic dose is adjusted.  We keep  a very careful flow sheet that documents all the changes.  Today he is feeling the best he has felt in a long time.  His fluid status is controlled.  His leg weakness is improving.  We really do not know all of the reasons why he has had leg problems.  I'm pleased that he is improving.  Current Medications (verified): 1)  Carvedilol 6.25 Mg Tabs (Carvedilol) .... Take One Tablet By Mouth Twice A Day 2)  Furosemide 40 Mg Tabs (Furosemide) .... Take 2 Tablets By Mouth Two Times A Day 3)  Klor-Con M20 20 Meq  Tbcr (Potassium Chloride Crys Cr) .Marland Kitchen.. 1 By Mouth Two Times A Day 4)  Prilosec Otc 20 Mg Tbec (Omeprazole Magnesium) .... Take One Tablet By Mouth Once Daily. 5)  Aspirin 81 Mg  Tabs (Aspirin) .Marland Kitchen.. 1 By Mouth Daily 6)  Bethanechol Chloride 25 Mg Tabs (Bethanechol Chloride) .... Three Times A Day 7)  Allopurinol 300 Mg Tabs (Allopurinol) .... Take One Tablet By Mouth Once Daily. 8)  Restasis 0.05 %  Emul (Cyclosporine) .... One Drop Each Eye Two Times A Day 9)  Ocuvite Preservision  Tabs (Multiple Vitamins-Minerals) .Marland Kitchen.. 1 Two Times A Day 10)  Flomax 0.4 Mg Xr24h-Cap (Tamsulosin Hcl) .... Take One Tablet By Mouth Once Daily. 11)  Nasal Moisturizing Spray 0.65 % Soln (Saline) .... Prn 12)  Alprazolam 0.25 Mg Tabs (Alprazolam) .... As Needed 13)  Plavix 75 Mg Tabs (Clopidogrel Bisulfate) .Marland Kitchen.. 1 By Mouth Daily 14)  Nitrostat 0.4 Mg Subl (Nitroglycerin) .... As Needed 15)  Acetaminophen 325 Mg  Tabs (Acetaminophen) .... As Needed 16)  Clonidine Hcl 0.1 Mg Tabs (Clonidine Hcl) .... One By Mouth Three Times A Day 17)   Isosorbide Mononitrate Cr 60 Mg Xr24h-Tab (Isosorbide Mononitrate) .... One By Mouth Three Times A Day 18)  Hydralazine Hcl 100 Mg Tabs (Hydralazine Hcl) .... 3/4 Tablet Three Times A Day 19)  Gabapentin 300 Mg Caps (Gabapentin) .... Three Times A Day 20)  Metolazone 2.5 Mg Tabs (Metolazone) .... Take One Tablet By Mouth Daily As Directed 21)  Colcrys 0.6 Mg Tabs (Colchicine) .Marland Kitchen.. 1 Tab Three Times A Day As Needed  Allergies (verified): 1)  ! Cortisone 2)  ! Augmentin 3)  ! Nsaids  Past History:  Past Medical History: Last updated: 06/16/2009 UCD Polio -70 y/o CABG 45 Coronary artery disease..catheterization 2006 new occlusion vein graft  / myoview..01/2006..inferior scar ..mild peri-infarct ischemia  /  non-STEMI April 03, 2009...Marland KitchenMarland Kitchen catheterization April 07, 2009... severe native disease... LIMA to the LAD patent, SVG to OM patent but sequential grafts to the posterior lateral of the OM and then the posterior lateral of the right occluded, SVG to diagonal occluded SVG to the right coronary artery occluded.... this was unchanged since 2006...  /   hospitalization...1.2011.Marland Kitchen elevated troponin/CHF.Marland Kitchen no work-up ischemic cardiomyopathy ICD  placed  05/12/2009 EF 40%  echo..01/2006..inferior/posterior akinesis /  30% echo... June, 2010... technically limited study A/C systolic [facility]..diuresed Combative in hospital  05/2009...treated  haldol... Carotid aretery disease...doppler..04/2007.Marland Kitchen0-39% RICA.Marland Kitchen60-79%  LICA  /   Doppler... March 21, 2009... 0-39% R. ICA, 60-79% LICA... unchanged he is no rebound Abdominal bruit but no abdominal aortic aneurysm and no renal artery stenosis Grief over wife having severe Alzheimer's bradycardia mild not symptomatic Gout.Marland Kitchenallopurinol and p.r.n. colchicine Hypertension Nephrolithiasis, hx of Prostatitis, hx of  Hyperlipidemia.Marland Kitchenintolerant to all cholesterol medications Cholelithiasis Chronic kidney disease.. Dr. Elvis Coil History  brain tumor removed successfully in the past  Niaspan intolerance melanoma...Marland Kitchenon his back... removed [facility] in the past Aortic root dilitation...mild.. Mild musculoskeletal back pain Renal and liver cysts on CT scan Chronic mild dizziness Augmentin allergy with swelling of the tongue Renal insufficiency... function varies  volume overloaded.  June 03, 2009... hospital discharge... BUN 30 / creatinine 1.67           Cardiology - Myrtis Ser          GU - Darvin Neighbours          Derm - Dr. Mayford Knife in [place]          Vasc Surgeon - Madilyn Fireman          GI - Yancey Flemings         GS - Gross  Review of Systems       Patient denies fever, chills, headache, sweats, rash, change in vision, change in hearing, chest pain, cough, shortness of breath, nausea vomiting, urinary symptoms.  All other systems are reviewed and are negative.  Vital Signs:  Patient profile:   70 year old male Height:      71 inches Weight:      239 pounds BMI:     33.45 Pulse rate:   65 / minute BP sitting:   118 / 66  (left arm) Cuff size:   regular  Vitals Entered By: Hardin Negus, RMA (July 24, 2009 10:14 AM)  Physical Exam  General:  patient is stable in general. Eyes:  no xanthelasma. Neck:  no jugular venous distention. Lungs:  lungs are clear respiratory effort is nonlabored. Heart:  cardiac exam reveals S1-S2.  No clicks or significant murmurs. Abdomen:  abdomen is soft. Extremities:  no peripheral edema today. Psych:  patient is oriented to person time and place.  Affect is normal.    ICD Specifications Following MD:  Lewayne Bunting, MD     ICD Vendor:  [employer]     ICD Model Number:  [id number]     ICD Serial Number:  [id number] ICD DOI:  05/12/2009     ICD Implanting MD:  Lewayne Bunting, MD  Lead 1:    Location: RV     DOI: 05/12/2009     Model #: [id number]     Serial #: [id number]     Status: active  ICD Follow Up ICD Dependent:  No      Episodes Coumadin:  No  Brady Parameters Mode VVI     Lower Rate Limit:  40       Tachy Zones VF:  240     VT:  200     VT1:  173     Impression & Recommendations:  Problem # 1:  EDEMA (ICD-782.3) Edema stable.  No change in therapy.  Problem # 2:  CAD (ICD-414.00)  His updated medication list for this problem includes:    Carvedilol 6.25 Mg Tabs (Carvedilol) .Marland Kitchen... Take one tablet by mouth twice a day    Plavix 75 Mg Tabs (Clopidogrel bisulfate) .Marland Kitchen... 1 by mouth daily    Nitrostat 0.4 Mg  Subl (Nitroglycerin) .Marland Kitchen... As needed    Isosorbide Mononitrate Cr 60 Mg Xr24h-tab (Isosorbide mononitrate) ..... One by mouth three times a day Coronary disease is stable.  No change in therapy.  Problem # 3:  * COMBATIVE BEHAVIOR / HOSPITAL / HALDOL USED The patient asked me today about the behavior he had in the hospital.  I explained to him that I felt that his combative behavior was probably related to his overall illness.  He is much better now.  No further workup.  Problem # 4:  * JERKING SENSATION IN HIS LEG AND BODY On an infrequent basis the patient will feel an unusual sensation in his leg and then the remainder of his body.  It does not sound like true seizures.  He thinks that a dose of colchicine helps him.  I have no explanation as to why this may be the case.  However it will be safe for him to use colchicine on a p.r.n. basis and I have given him permission to do so.  Patient Instructions: 1)  Follow up in 6 weeks

## 2010-06-11 NOTE — Assessment & Plan Note (Signed)
Summary: post urgent care in [name]/post ER [facility] 6/4/cd   Vital Signs:  Patient profile:   70 year old male Height:      71 inches Weight:      241 pounds BMI:     33.73 O2 Sat:      95 % on Room air Temp:     97.1 degrees F oral Pulse rate:   54 / minute BP sitting:   118 / 68  (left arm) Cuff size:   large  Vitals Entered By: Bill Salinas CMA (October 22, 2009 10:33 AM)  O2 Flow:  Room air CC: pt here for follow up from urgent care, pt c/o congestion with cough (producing yellowish, green mucous)/ ab   Primary Care Provider:  Jacques Navy MD  CC:  pt here for follow up from urgent care, pt c/o congestion with cough (producing yellowish, and green mucous)/ ab.  History of Present Illness: Patient is having symptoms of prostatism: slow stream, spray, dribble.   May 5th he fell and damaged his right knee and orthopedic surgeon suggested TKR.   He did go to [facility] ED for SOB and he was treated with lasix and was sent home. Two days later, Middle of May,  he went ot Urgent Care for a buildup of mucus which he couldn't clear - he was treated with azithromycin 500mg  once daily x 3 and mucinex. This treatment worked. Since then he has been doing well.: working in his shop, mowing, usual activities.   Chart reviewed: he has been seen by Dr. Myrtis Ser routinely with last visit 4/25. He has been stable with stable weight and no signs of decompensation. His progressive renal insufficiency with Cr rising from 1.67 to 2.7 noted and attributed to strict fluid restriction and diuretic regimen. He saw Dr. Levonne Spiller for device clinic 08/12/09 and was stable. He has seen Dr. Nickola Major 4/14 for gout follow-up. Last lab 09/11/09 with Cr 2.7 which is holding steady.  Current Medications (verified): 1)  Carvedilol 6.25 Mg Tabs (Carvedilol) .... Take One Tablet By Mouth Twice A Day 2)  Furosemide 40 Mg Tabs (Furosemide) .... Take 2 Tablets in Am 1 Tab in Pm 3)  Klor-Con M20 20 Meq  Tbcr (Potassium  Chloride Crys Cr) .Marland Kitchen.. 1 By Mouth Two Times A Day 4)  Prilosec Otc 20 Mg Tbec (Omeprazole Magnesium) .... Take One Tablet By Mouth Once Daily. 5)  Aspirin Ec 325 Mg Tbec (Aspirin) .... Take One Tablet By Mouth Daily 6)  Bethanechol Chloride 25 Mg Tabs (Bethanechol Chloride) .... Three Times A Day 7)  Allopurinol 300 Mg Tabs (Allopurinol) .... Take One Tablet By Mouth Once Daily. 8)  Restasis 0.05 %  Emul (Cyclosporine) .... One Drop Each Eye Two Times A Day 9)  Ocuvite Preservision  Tabs (Multiple Vitamins-Minerals) .Marland Kitchen.. 1 Two Times A Day 10)  Flomax 0.4 Mg Xr24h-Cap (Tamsulosin Hcl) .... Take One Tablet By Mouth Once Daily. 11)  Nasal Moisturizing Spray 0.65 % Soln (Saline) .... Prn 12)  Alprazolam 0.25 Mg Tabs (Alprazolam) .... As Needed 13)  Plavix 75 Mg Tabs (Clopidogrel Bisulfate) .Marland Kitchen.. 1 By Mouth Daily 14)  Nitrostat 0.4 Mg Subl (Nitroglycerin) .... As Needed 15)  Acetaminophen 325 Mg  Tabs (Acetaminophen) .... As Needed 16)  Clonidine Hcl 0.1 Mg Tabs (Clonidine Hcl) .... One By Mouth Three Times A Day 17)  Isosorbide Mononitrate Cr 60 Mg Xr24h-Tab (Isosorbide Mononitrate) .... One By Mouth Three Times A Day 18)  Hydralazine Hcl 100 Mg  Tabs (Hydralazine Hcl) .... 3/4 Tablet Three Times A Day 19)  Gabapentin 300 Mg Caps (Gabapentin) .... Three Times A Day 20)  Metolazone 2.5 Mg Tabs (Metolazone) .... Take One Tablet By Mouth Daily As Directed 21)  Colcrys 0.6 Mg Tabs (Colchicine) .Marland Kitchen.. 1 Tab Three Times A Day As Needed 22)  Isosorbide Dinitrate 10 Mg Tabs (Isosorbide Dinitrate) .... Take One Capsule By Mouth Three Times A Day 23)  Multivitamins   Tabs (Multiple Vitamin) .... Once Daily 24)  Mucinex D 60-600 Mg Xr12h-Tab (Pseudoephedrine-Guaifenesin) .... Daily  Allergies (verified): 1)  ! Cortisone 2)  ! Augmentin 3)  ! Nsaids  Past History:  Past Medical History: Last updated: 06/16/2009 UCD Polio -70 y/o CABG 40 Coronary artery disease..catheterization 2006 new occlusion vein  graft  / myoview..01/2006..inferior scar ..mild peri-infarct ischemia  /  non-STEMI April 03, 2009...Marland KitchenMarland Kitchen catheterization April 07, 2009... severe native disease... LIMA to the LAD patent, SVG to OM patent but sequential grafts to the posterior lateral of the OM and then the posterior lateral of the right occluded, SVG to diagonal occluded SVG to the right coronary artery occluded.... this was unchanged since 2006...  /   hospitalization...1.2011.Marland Kitchen elevated troponin/CHF.Marland Kitchen no work-up ischemic cardiomyopathy ICD  placed  05/12/2009 EF 40%  echo..01/2006..inferior/posterior akinesis /  30% echo... June, 2010... technically limited study A/C systolic CHF.[facility] 05/31/2009..diuresed Combative in hospital  05/2009...treated  haldol... Carotid aretery disease...doppler..04/2007.Marland Kitchen0-39% RICA.Marland Kitchen60-79%  LICA  /   Doppler... March 21, 2009... 0-39% R. ICA, 60-79% LICA... unchanged he is no rebound Abdominal bruit but no abdominal aortic aneurysm and no renal artery stenosis Grief over wife having severe Alzheimer's bradycardia mild not symptomatic Gout.Marland Kitchenallopurinol and p.r.n. colchicine Hypertension Nephrolithiasis, hx of Prostatitis, hx of  Hyperlipidemia.Marland Kitchenintolerant to all cholesterol medications Cholelithiasis Chronic kidney disease.. Dr. 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 removed at [facility] in the past Aortic root dilitation...mild.. Mild musculoskeletal back pain Renal and liver cysts on CT scan Chronic mild dizziness Augmentin allergy with swelling of the tongue Renal insufficiency... function varies  volume overloaded.  June 03, 2009... hospital discharge... BUN 30 / creatinine 1.67           Cardiology - Myrtis Ser          GU - Ron Earlene Plater          Derm - Dr. Mayford Knife in [place]          Vasc Surgeon - Madilyn Fireman          GI - Yancey Flemings         GS - Gross  Past Surgical History: Last updated:  2007-06-24 arthoroscopy-right knee nephrolithiasis surgery - August '87 brain tumor - excision August '99 melanoma excision from back - June '82 Dr. Pixie Casino [facility] Coronary artery bypass graft- LIMA to LAD; SVG to dx, OM, Cx, RCA  '93 Lap Chole  October '08  Family History: Last updated: June 24, 2007 father - deceased, accidental death mother- CAD/MI Neg - colon, prostate cancer; DM  Social History: Last updated: 06-24-2007 HSG, some college work Married '65 1 son retired - wharehouse clerk [employer]  Review of Systems  The patient denies anorexia, fever, weight loss, weight gain, decreased hearing, hoarseness, chest pain, syncope, dyspnea on exertion, peripheral edema, hemoptysis, abdominal pain, hematuria, genital sores, muscle weakness, transient blindness, depression, abnormal bleeding, and enlarged lymph nodes.    Physical Exam  General:  WNWD white male looking older than his stated  age Head:  normocephalic and atraumatic.   Eyes:  pupils equal, pupils round, corneas and lenses clear, and no injection.   Neck:  supple, full ROM, and no thyromegaly.   Lungs:  normal respiratory effort, no intercostal retractions, no accessory muscle use, normal breath sounds, no crackles, and no wheezes.   Heart:  normal rate and regular rhythm.  Heart sounds distant. AICD left chest. Abdomen:  soft and normal bowel sounds.   Msk:  wearing a brace on the right knee. Walks with a assist device. Neurologic:  alert & oriented X3 and cranial nerves II-XII intact.   Skin:  has rash right side of nose with remnants of a cream on the skin. No distinct lesion seen.  Psych:  Oriented X3, memory intact for recent and remote, and normally interactive.     Impression & Recommendations:  Problem # 1:  RENAL INSUFFICIENCY (ICD-588.9) Last Bmet 09/11/09 with Cr 2.7. This has been stable since April.   Plan - repeat Bmet at next office visit           May need renal consult if GFR  continues to fall.  Problem # 2:  CAD (ICD-414.00) Presently stable today with no compliaint of chest pain. He has had no device firing. He is current with Dr. Myrtis Ser and Ladona Ridgel  His updated medication list for this problem includes:    Carvedilol 6.25 Mg Tabs (Carvedilol) .Marland Kitchen... Take one tablet by mouth twice a day    Furosemide 40 Mg Tabs (Furosemide) .Marland Kitchen... Take 2 tablets in am 1 tab in pm    Aspirin Ec 325 Mg Tbec (Aspirin) .Marland Kitchen... Take one tablet by mouth daily    Plavix 75 Mg Tabs (Clopidogrel bisulfate) .Marland Kitchen... 1 by mouth daily    Nitrostat 0.4 Mg Subl (Nitroglycerin) .Marland Kitchen... As needed    Clonidine Hcl 0.1 Mg Tabs (Clonidine hcl) ..... One by mouth three times a day    Isosorbide Mononitrate Cr 60 Mg Xr24h-tab (Isosorbide mononitrate) ..... One by mouth three times a day    Hydralazine Hcl 100 Mg Tabs (Hydralazine hcl) .Marland Kitchen... 3/4 tablet three times a day    Metolazone 2.5 Mg Tabs (Metolazone) .Marland Kitchen... Take one tablet by mouth daily as directed    Isosorbide Dinitrate 10 Mg Tabs (Isosorbide dinitrate) .Marland Kitchen... Take one capsule by mouth three times a day  Problem # 3:  CHRONIC SYSTOLIC HEART FAILURE (ICD-428.22) Recent episode of apparent fluid overload. No records from [facility] available. He seems to be doing well at this time with signs of decompensation.  Plan - continue all medications           follow-up with Dr. Myrtis Ser as scheduled  His updated medication list for this problem includes:    Carvedilol 6.25 Mg Tabs (Carvedilol) .Marland Kitchen... Take one tablet by mouth twice a day    Furosemide 40 Mg Tabs (Furosemide) .Marland Kitchen... Take 2 tablets in am 1 tab in pm    Aspirin Ec 325 Mg Tbec (Aspirin) .Marland Kitchen... Take one tablet by mouth daily    Plavix 75 Mg Tabs (Clopidogrel bisulfate) .Marland Kitchen... 1 by mouth daily    Metolazone 2.5 Mg Tabs (Metolazone) .Marland Kitchen... Take one tablet by mouth daily as directed  Problem # 4:  HYPERTENSION (ICD-401.9)  His updated medication list for this problem includes:    Carvedilol  6.25 Mg Tabs (Carvedilol) .Marland Kitchen... Take one tablet by mouth twice a day    Furosemide 40 Mg Tabs (Furosemide) .Marland Kitchen... Take 2 tablets in am 1  tab in pm    Clonidine Hcl 0.1 Mg Tabs (Clonidine hcl) ..... One by mouth three times a day    Hydralazine Hcl 100 Mg Tabs (Hydralazine hcl) .Marland Kitchen... 3/4 tablet three times a day    Metolazone 2.5 Mg Tabs (Metolazone) .Marland Kitchen... Take one tablet by mouth daily as directed  BP today: 118/68 Prior BP: 128/80 (09/01/2009)  Labs Reviewed: K+: 3.0 (09/11/2009) Creat: : 2.7 (09/11/2009)     BP is stable on present medications.  Problem # 5:  GOUT (ICD-274.9) No recent flares. Current with rheumatology  His updated medication list for this problem includes:    Allopurinol 300 Mg Tabs (Allopurinol) .Marland Kitchen... Take one tablet by mouth once daily.    Colcrys 0.6 Mg Tabs (Colchicine) .Marland Kitchen... 1 tab three times a day as needed  Problem # 6:  OSTEOARTHRITIS, KNEE, RIGHT (ICD-715.96) Continued pain in the right knee. Ortho has suggested TKR but the patient isn't ready. He would be at risk for complications post-op given his multiple co-morbidities.  Plan - continue to use knee brace           APAP for pain.  His updated medication list for this problem includes:    Aspirin Ec 325 Mg Tbec (Aspirin) .Marland Kitchen... Take one tablet by mouth daily    Acetaminophen 325 Mg Tabs (Acetaminophen) .Marland Kitchen... As needed  Complete Medication List: 1)  Carvedilol 6.25 Mg Tabs (Carvedilol) .... Take one tablet by mouth twice a day 2)  Furosemide 40 Mg Tabs (Furosemide) .... Take 2 tablets in am 1 tab in pm 3)  Klor-con M20 20 Meq Tbcr (Potassium chloride crys cr) .Marland Kitchen.. 1 by mouth two times a day 4)  Prilosec Otc 20 Mg Tbec (Omeprazole magnesium) .... Take one tablet by mouth once daily. 5)  Aspirin Ec 325 Mg Tbec (Aspirin) .... Take one tablet by mouth daily 6)  Bethanechol Chloride 25 Mg Tabs (Bethanechol chloride) .... Three times a day 7)  Allopurinol 300 Mg Tabs (Allopurinol) .... Take one tablet by  mouth once daily. 8)  Restasis 0.05 % Emul (Cyclosporine) .... One drop each eye two times a day 9)  Ocuvite Preservision Tabs (Multiple vitamins-minerals) .Marland Kitchen.. 1 two times a day 10)  Flomax 0.4 Mg Xr24h-cap (Tamsulosin hcl) .... Take one tablet by mouth once daily. 11)  Nasal Moisturizing Spray 0.65 % Soln (Saline) .... Prn 12)  Alprazolam 0.25 Mg Tabs (Alprazolam) .... As needed 13)  Plavix 75 Mg Tabs (Clopidogrel bisulfate) .Marland Kitchen.. 1 by mouth daily 14)  Nitrostat 0.4 Mg Subl (Nitroglycerin) .... As needed 15)  Acetaminophen 325 Mg Tabs (Acetaminophen) .... As needed 16)  Clonidine Hcl 0.1 Mg Tabs (Clonidine hcl) .... One by mouth three times a day 17)  Isosorbide Mononitrate Cr 60 Mg Xr24h-tab (Isosorbide mononitrate) .... One by mouth three times a day 18)  Hydralazine Hcl 100 Mg Tabs (Hydralazine hcl) .... 3/4 tablet three times a day 19)  Gabapentin 300 Mg Caps (Gabapentin) .... Twice  times a day 20)  Metolazone 2.5 Mg Tabs (Metolazone) .... Take one tablet by mouth daily as directed 21)  Colcrys 0.6 Mg Tabs (Colchicine) .Marland Kitchen.. 1 tab three times a day as needed 22)  Isosorbide Dinitrate 10 Mg Tabs (Isosorbide dinitrate) .... Take one capsule by mouth three times a day 23)  Multivitamins Tabs (Multiple vitamin) .... Once daily 24)  Mucinex D 60-600 Mg Xr12h-tab (Pseudoephedrine-guaifenesin) .... Daily

## 2010-06-11 NOTE — Assessment & Plan Note (Signed)
Summary: dr men pt/no clinic this wk--dizziness--stc   Vital Signs:  Patient profile:   70 year old male Height:      71 inches (180.34 cm) Weight:      241 pounds (109.55 kg) O2 Sat:      95 % on Room air Temp:     98.3 degrees F (36.83 degrees C) oral Pulse rate:   58 / minute BP sitting:   132 / 70  (left arm) Cuff size:   large  Vitals Entered By: Orlan Leavens (November 12, 2009 10:29 AM)  O2 Flow:  Room air CC: Ongoing dizziness Is Patient Diabetic? No Pain Assessment Patient in pain? no        Primary Care Provider:  Jacques Navy MD  CC:  Ongoing dizziness.  History of Present Illness: c/o dizziness onset 2 weeks ago- associated with accidental fall 10/23/09- required ER eval for same - given valium but not helping symptoms - so stopped use changed to OTC dramamine with good relief - symptoms predominately orthostatic - worst in AM when getting out of bed - no symptoms ever while at rest - no CP or weakness - no vision changes - denies change in UOP or chronic edema - no dose or med changes of rx diuretics -  Clinical Review Panels:  CBC   WBC:  8.2 (05/06/2009)   RBC:  3.88 (05/06/2009)   Hgb:  12.7 (05/06/2009)   Hct:  37.2 (05/06/2009)   Platelets:  98.0 (05/06/2009)   MCV  95.9 (05/06/2009)   MCHC  34.1 (05/06/2009)   RDW  15.2 (05/06/2009)   PMN:  67.1 (05/06/2009)   Lymphs:  21.8 (05/06/2009)   Monos:  9.2 (05/06/2009)   Eosinophils:  1.5 (05/06/2009)   Basophil:  0.4 (05/06/2009)  Complete Metabolic Panel   Glucose:  129 (09/11/2009)   Sodium:  144 (09/11/2009)   Potassium:  3.0 (09/11/2009)   Chloride:  99 (09/11/2009)   CO2:  35 (09/11/2009)   BUN:  53 (09/11/2009)   Creatinine:  2.7 (09/11/2009)   Albumin:  3.9 (12/05/2006)   Total Protein:  6.9 (12/05/2006)   Calcium:  8.7 (09/11/2009)   Total Bili:  1.3 (12/05/2006)   Alk Phos:  44 (12/05/2006)   SGPT (ALT):  29 (12/05/2006)   SGOT (AST):  22 (12/05/2006)   Current Medications  (verified): 1)  Carvedilol 6.25 Mg Tabs (Carvedilol) .... Take One Tablet By Mouth Twice A Day 2)  Furosemide 40 Mg Tabs (Furosemide) .... Take 2 Tablets in Am 1 Tab in Pm 3)  Klor-Con M20 20 Meq  Tbcr (Potassium Chloride Crys Cr) .Marland Kitchen.. 1 By Mouth Two Times A Day 4)  Prilosec Otc 20 Mg Tbec (Omeprazole Magnesium) .... Take One Tablet By Mouth Once Daily. 5)  Aspirin Ec 325 Mg Tbec (Aspirin) .... Take One Tablet By Mouth Daily 6)  Bethanechol Chloride 25 Mg Tabs (Bethanechol Chloride) .... Three Times A Day 7)  Allopurinol 300 Mg Tabs (Allopurinol) .... Take One Tablet By Mouth Once Daily. 8)  Restasis 0.05 %  Emul (Cyclosporine) .... One Drop Each Eye Two Times A Day 9)  Ocuvite Preservision  Tabs (Multiple Vitamins-Minerals) .Marland Kitchen.. 1 Two Times A Day 10)  Flomax 0.4 Mg Xr24h-Cap (Tamsulosin Hcl) .... Take One Tablet By Mouth Once Daily. 11)  Nasal Moisturizing Spray 0.65 % Soln (Saline) .... Prn 12)  Alprazolam 0.25 Mg Tabs (Alprazolam) .... As Needed 13)  Plavix 75 Mg Tabs (Clopidogrel Bisulfate) .Marland Kitchen.. 1 By Mouth  Daily 14)  Nitrostat 0.4 Mg Subl (Nitroglycerin) .... As Needed 15)  Acetaminophen 325 Mg  Tabs (Acetaminophen) .... As Needed 16)  Clonidine Hcl 0.1 Mg Tabs (Clonidine Hcl) .... One By Mouth Three Times A Day 17)  Isosorbide Mononitrate Cr 60 Mg Xr24h-Tab (Isosorbide Mononitrate) .... One By Mouth Three Times A Day 18)  Hydralazine Hcl 100 Mg Tabs (Hydralazine Hcl) .... 3/4 Tablet Three Times A Day 19)  Gabapentin 300 Mg Caps (Gabapentin) .... Twice  Times A Day 20)  Metolazone 2.5 Mg Tabs (Metolazone) .... Take One Tablet By Mouth Daily As Directed 21)  Colcrys 0.6 Mg Tabs (Colchicine) .Marland Kitchen.. 1 Tab Three Times A Day As Needed 22)  Isosorbide Dinitrate 10 Mg Tabs (Isosorbide Dinitrate) .... Take One Capsule By Mouth Three Times A Day 23)  Multivitamins   Tabs (Multiple Vitamin) .... Once Daily 24)  Mucinex D 60-600 Mg Xr12h-Tab (Pseudoephedrine-Guaifenesin) .... Daily 25)  Dramamine 50  Mg Tabs (Dimenhydrinate) .... Take 1 By Mouth Once Daily  Allergies (verified): 1)  ! Cortisone 2)  ! Augmentin 3)  ! 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 severe native disease... LIMA to the LAD patent, SVG to OM patent but sequential grafts to the posterior lateral of the OM and then the posterior lateral of the right occluded, SVG to diagonal occluded SVG to the right coronary artery occluded.... this was unchanged since 2006...  /   hospitalization...1.2011.Marland Kitchen elevated troponin/CHF.Marland Kitchen no work-up ischemic cardiomyopathy ICD  placed  05/12/2009 EF 40%  echo..01/2006..inferior/posterior akinesis /  30% echo... June, 2010... technically limited study A/C systolic CHF.[facility]..diuresed Combative in hospital  05/2009...treated  haldol... Carotid aretery disease...doppler..04/2007.Marland Kitchen0-39% RICA.Marland Kitchen60-79%  LICA  /   Doppler... March 21, 2009... 0-39% R. ICA, 60-79% LICA... unchanged he is no rebound Abdominal bruit but no abdominal aortic aneurysm and no renal artery stenosis Grief over wife having severe Alzheimer's bradycardia mild not symptomatic Gout.Marland Kitchenallopurinol and p.r.n. colchicine Hypertension Nephrolithiasis, hx of Prostatitis, hx of  Hyperlipidemia.Marland Kitchenintolerant to all cholesterol medications Cholelithiasis Chronic kidney disease.. Dr. 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 removed at [facility] in the past Aortic root dilitation...mild.. Mild musculoskeletal back pain Renal and liver cysts on CT scan Chronic mild dizziness Augmentin allergy with swelling of the tongue Renal insufficiency... function varies  volume overloaded.  June 03, 2009... hospital  discharge... BUN 30 / creatinine 1.67           Cardiology - Myrtis Ser          GU - Darvin Neighbours          Derm - Dr. Mayford Knife in [place]          Vasc Surgeon - Madilyn Fireman          GI - Yancey Flemings         GS - Gross  Review of Systems  The patient denies chest pain, syncope, and headaches.    Physical Exam  General:  WNWD white male looking older than his stated age - nontoxic Eyes:  vision grossly intact; pupils equal, round and reactive to light.  conjunctiva and lids normal.   no nystagmus Ears:  normal pinnae bilaterally, without erythema, swelling, or tenderness to palpation.  R TM clear, without effusion, or cerumen  impaction. L TM hazy but no effusion. Hearing grossly normal bilaterally  Mouth:  teeth and gums in good repair; mucous membranes moist, without lesions or ulcers. oropharynx clear without exudate, no erythema.  Lungs:  normal respiratory effort, no intercostal retractions, no accessory muscle use, normal breath sounds, no crackles, and no wheezes.   Heart:  normal rate and regular rhythm.  Heart sounds distant. AICD left chest. Neurologic:  alert & oriented X3 and cranial nerves II-XII intact.     Impression & Recommendations:  Problem # 1:  DIZZINESS (ICD-780.4)  hx same - orthostatic by hx - no resting symptoms  neuro exam benign, HD stable ?vol deplete or compromise with CKD and diuretic use -  reports heat is not an issue for him as he avoids exposure outdoors - good relief with antivert in past - erx done for same now to f/u if unimproved or worse His updated medication list for this problem includes:    Dramamine 50 Mg Tabs (Dimenhydrinate) .Marland Kitchen... Take 1 by mouth once daily    Antivert 25 Mg Tabs (Meclizine hcl) .Marland Kitchen... 1 by mouth every 4 hours as needed for dizzy symptoms  Orders: Prescription Created Electronically [phone])  Problem # 2:  RENAL INSUFFICIENCY (ICD-588.9)  Orders: TLB-BMP (Basic Metabolic Panel-BMET) (80048-METABOL)  Last Bmet 09/11/09 with Cr  2.7. This has been stable since April.   repeat Bmet now to exclude progressive dz contribution to dizzy symptoms above -           May need renal consult if GFR continues to fall.  Complete Medication List: 1)  Carvedilol 6.25 Mg Tabs (Carvedilol) .... Take one tablet by mouth twice a day 2)  Furosemide 40 Mg Tabs (Furosemide) .... Take 2 tablets in am 1 tab in pm 3)  Klor-con M20 20 Meq Tbcr (Potassium chloride crys cr) .Marland Kitchen.. 1 by mouth two times a day 4)  Prilosec Otc 20 Mg Tbec (Omeprazole magnesium) .... Take one tablet by mouth once daily. 5)  Aspirin Ec 325 Mg Tbec (Aspirin) .... Take one tablet by mouth daily 6)  Bethanechol Chloride 25 Mg Tabs (Bethanechol chloride) .... Three times a day 7)  Allopurinol 300 Mg Tabs (Allopurinol) .... Take one tablet by mouth once daily. 8)  Restasis 0.05 % Emul (Cyclosporine) .... One drop each eye two times a day 9)  Ocuvite Preservision Tabs (Multiple vitamins-minerals) .Marland Kitchen.. 1 two times a day 10)  Flomax 0.4 Mg Xr24h-cap (Tamsulosin hcl) .... Take one tablet by mouth once daily. 11)  Nasal Moisturizing Spray 0.65 % Soln (Saline) .... Prn 12)  Alprazolam 0.25 Mg Tabs (Alprazolam) .... As needed 13)  Plavix 75 Mg Tabs (Clopidogrel bisulfate) .Marland Kitchen.. 1 by mouth daily 14)  Nitrostat 0.4 Mg Subl (Nitroglycerin) .... As needed 15)  Acetaminophen 325 Mg Tabs (Acetaminophen) .... As needed 16)  Clonidine Hcl 0.1 Mg Tabs (Clonidine hcl) .... One by mouth three times a day 17)  Isosorbide Mononitrate Cr 60 Mg Xr24h-tab (Isosorbide mononitrate) .... One by mouth three times a day 18)  Hydralazine Hcl 100 Mg Tabs (Hydralazine hcl) .... 3/4 tablet three times a day 19)  Gabapentin 300 Mg Caps (Gabapentin) .... Twice  times a day 20)  Metolazone 2.5 Mg Tabs (Metolazone) .... Take one tablet by mouth daily as directed 21)  Colcrys 0.6 Mg Tabs (Colchicine) .Marland Kitchen.. 1 tab three times a day as needed 22)  Isosorbide Dinitrate 10 Mg Tabs (Isosorbide dinitrate) .... Take  one capsule by mouth three times a day  23)  Multivitamins Tabs (Multiple vitamin) .... Once daily 24)  Mucinex D 60-600 Mg Xr12h-tab (Pseudoephedrine-guaifenesin) .... Daily 25)  Dramamine 50 Mg Tabs (Dimenhydrinate) .... Take 1 by mouth once daily 26)  Antivert 25 Mg Tabs (Meclizine hcl) .Marland Kitchen.. 1 by mouth every 4 hours as needed for dizzy symptoms  Patient Instructions: 1)  it was good to see you today. 2)  test(s) ordered today - your results will be called to you in 24-48 hours after review 3)  Antivert for dizzy symptoms - your prescription has been electronically submitted to your pharmacy. Please take as directed. Contact our office if you believe you're having problems with the medication(s).  4)  Get plenty of rest, drink lots of clear liquids - careful with the heat! Return in 7-10 days if you're not better:sooner if you're feeling worse. Prescriptions: ANTIVERT 25 MG TABS (MECLIZINE HCL) 1 by mouth every 4 hours as needed for dizzy symptoms  #40 x 1   Entered and Authorized by:   Newt Lukes MD   Signed by:   Newt Lukes MD on 11/12/2009   Method used:   Electronically to        [facility]  [id number] (retail)       [address]       [place], [place]  [address]       Ph: [phone]       Fax: [phone]   RxID:   [id number]

## 2010-06-11 NOTE — Assessment & Plan Note (Signed)
Summary: f3w  Medications Added CLONIDINE HCL 0.1 MG TABS (CLONIDINE HCL) 1 tab every evening, hold AM dose HYDRALAZINE HCL 100 MG TABS (HYDRALAZINE HCL) 3/4 tablet every evening, hold AM dose BETHANECHOL CHLORIDE 25 MG TABS (BETHANECHOL CHLORIDE) three times a day METHOTREXATE 2.5 MG TABS (METHOTREXATE SODIUM) uad ANTIVERT 25 MG TABS (MECLIZINE HCL) uad      Allergies Added:   Visit Type:  Follow-up Referring Joseantonio Dittmar:  Zenovia Jordan, MD Primary Axten Pascucci:  Jacques Navy MD  CC:  cardiomyopathy / dizziness.  History of Present Illness: The patient is seen for followup of cardiomyopathy and dizziness.  We have tried very carefully to have him on appropriate medications for his hypertension and cardiomyopathy.  Recently he's had worsening dizziness.  Meclizine seemed to help some of the dizziness.  However he is not describe classic symptoms of vertigo at this time.  He has the problem primarily in the morning.  He begins to move around and has spells of feeling dizzy I can only to his falling.  He is unable to get up and move around.  His symptoms sound orthostatic.  He has problems with significant volume overload and I'm hesitant to adjust his diuretics any further.  Current Medications (verified): 1)  Carvedilol 6.25 Mg Tabs (Carvedilol) .... Take One Tablet By Mouth Twice A Day 2)  Furosemide 40 Mg Tabs (Furosemide) .... Take 1 Tablet Two Times A Day Extra As Needed 3)  Klor-Con M20 20 Meq  Tbcr (Potassium Chloride Crys Cr) .Marland Kitchen.. 1 By Mouth Two Times A Day 4)  Prilosec Otc 20 Mg Tbec (Omeprazole Magnesium) .... Take One Tablet By Mouth Once Daily. 5)  Aspirin Ec 325 Mg Tbec (Aspirin) .... Take One Tablet By Mouth Daily 6)  Allopurinol 300 Mg Tabs (Allopurinol) .... Take One Tablet By Mouth Once Daily. 7)  Restasis 0.05 %  Emul (Cyclosporine) .... One Drop Each Eye Two Times A Day 8)  Ocuvite Preservision  Tabs (Multiple Vitamins-Minerals) .Marland Kitchen.. 1 Two Times A Day 9)  Flomax 0.4 Mg  Xr24h-Cap (Tamsulosin Hcl) .... Take One Tablet By Mouth Once Daily. 10)  Nasal Moisturizing Spray 0.65 % Soln (Saline) .... Prn 11)  Alprazolam 0.25 Mg Tabs (Alprazolam) .... As Needed 12)  Plavix 75 Mg Tabs (Clopidogrel Bisulfate) .Marland Kitchen.. 1 By Mouth Daily 13)  Nitrostat 0.4 Mg Subl (Nitroglycerin) .... As Needed 14)  Acetaminophen 325 Mg  Tabs (Acetaminophen) .... As Needed 15)  Clonidine Hcl 0.1 Mg Tabs (Clonidine Hcl) .... One By Mouth Two Times A Day 16)  Isosorbide Dinitrate Cr 40 Mg Cr-Tabs (Isosorbide Dinitrate) .... Take One Capsule By Mouth Two Times A Day 17)  Hydralazine Hcl 100 Mg Tabs (Hydralazine Hcl) .... 3/4 Tablet Two Times A Day 18)  Gabapentin 300 Mg Caps (Gabapentin) .... Twice  Times A Day 19)  Metolazone 2.5 Mg Tabs (Metolazone) .... Take One Tablet By Mouth Daily As Directed 20)  Colcrys 0.6 Mg Tabs (Colchicine) .Marland Kitchen.. 1 Tab Three Times A Day As Needed 21)  Multivitamins   Tabs (Multiple Vitamin) .... Once Daily 22)  Mucinex D 60-600 Mg Xr12h-Tab (Pseudoephedrine-Guaifenesin) .... Daily As Needed Fofr Thick, Sticky Mucus 23)  Antivert 25 Mg Tabs (Meclizine Hcl) .Marland Kitchen.. 1 By Mouth Every 4 Hours As Needed For Dizzy Symptoms 24)  Fluticasone Propionate 50 Mcg/act Susp (Fluticasone Propionate) .... 2 Spray/side Once Daily 25)  Tramadol Hcl 50 Mg Tabs (Tramadol Hcl) .Marland Kitchen.. 1-2 By Mouth Q 6 Hrs As Needed 26)  Bethanechol Chloride 25 Mg  Tabs (Bethanechol Chloride) .... Three Times A Day 27)  Methotrexate 2.5 Mg Tabs (Methotrexate Sodium) .... Uad 28)  Antivert 25 Mg Tabs (Meclizine Hcl) .... Uad  Allergies (verified): 1)  ! Cortisone 2)  ! Augmentin 3)  ! Nsaids  Past History:  Past Medical History: UCD Polio -70 y/o  CABG 58 Coronary artery disease..catheterization 2006 new occlusion vein graft  / myoview..01/2006..inferior scar ..mild peri-infarct ischemia  /  non-STEMI April 03, 2009...Marland KitchenMarland Kitchen catheterization April 07, 2009... severe native disease... LIMA to the LAD patent,  SVG to OM patent but sequential grafts to the posterior lateral of the OM and then the posterior lateral of the right occluded, SVG to diagonal occluded SVG to the right coronary artery occluded.... this was unchanged since 2006...  /   hospitalization...1.2011.Marland Kitchen elevated troponin/CHF.Marland Kitchen no work-up ischemic cardiomyopathy ICD  placed  05/12/2009 EF 40%  echo..01/2006..inferior/posterior akinesis /  30% echo... June, 2010... technically limited study A/C systolic CHF.[facility]..diuresed Combative in hospital  05/2009...treated  haldol... Carotid aretery disease...doppler..04/2007.Marland Kitchen0-39% RICA.Marland Kitchen60-79%  LICA  /   Doppler... March 21, 2009... 0-39% R. ICA, 60-79% LICA... unchanged he is no rebound Abdominal bruit but no abdominal aortic aneurysm and no renal artery stenosis Grief over wife having severe Alzheimer's bradycardia mild not symptomatic Gout.Marland Kitchenallopurinol and p.r.n. colchicine Hypertension Nephrolithiasis, hx of Prostatitis, hx of  Hyperlipidemia.Marland Kitchenintolerant to all cholesterol medications Cholelithiasis Chronic kidney disease.. Dr. 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 [facility] in the past Aortic root dilitation...mild.. Mild musculoskeletal back pain Renal and liver cysts on CT scan Chronic mild dizziness   worse.. July, 2011 Augmentin allergy. swelling  tongue Renal insufficiency... function varies  volume overloaded.  June 03, 2009... hospital discharge... BUN 30 / creatinine 1.67           Cardiology - Myrtis Ser          GU - Darvin Neighbours          Derm - Dr. Mayford Knife in [place]          Vasc Surgeon - Madilyn Fireman          GI - Yancey Flemings         GS - Gross  Review of Systems       Patient denies fever, chills, headache, sweats, rash, change in vision, change in hearing, chest pain, nausea vomiting, urinary symptoms.  All other systems are reviewed and are negative.  Vital Signs:  Patient  profile:   70 year old male Height:      71 inches Weight:      247 pounds Pulse rate:   56 / minute BP sitting:   118 / 68  (right arm)  Vitals Entered By: Laurance Flatten CMA (December 18, 2009 9:12 AM)  Physical Exam  General:  today the patient is not as energetic as usual.  He is of course concerned about his dizziness and falling. Head:  head is atraumatic. Eyes:  no xanthelasma. Neck:  no jugular venous distention. Chest Wall:  no chest wall tenderness. Lungs:  lungs are clear.  Respiratory effort is nonlabored. Heart:  cardiac exam reveals S1-S2.  No clicks or significant murmurs. Abdomen:  abdomen is soft. Msk:  no musculoskeletal deformities. Extremities:  no peripheral edema today. Skin:  no skin rashes. Psych:  patient is oriented to person time and place.  Affect is normal.    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  At this point it seems most likely that this is from orthostasis.  I decided to adjust his medications related to the timing of his problems.  We will hold his morning clonidine and his morning hydralazine.  Other medications will be continued the same.  I am very hesitant to reduce his diuretics.  Problem # 2:  DIZZINESS (ICD-780.4) See the note above  Problem # 3:  EDEMA (ICD-782.3) There is no significant edema today.  Problem # 4:  CAD (ICD-414.00)  His  updated medication list for this problem includes:    Carvedilol 6.25 Mg Tabs (Carvedilol) .Marland Kitchen... Take one tablet by mouth twice a day    Aspirin Ec 325 Mg Tbec (Aspirin) .Marland Kitchen... Take one tablet by mouth daily    Plavix 75 Mg Tabs (Clopidogrel bisulfate) .Marland Kitchen... 1 by mouth daily    Nitrostat 0.4 Mg Subl (Nitroglycerin) .Marland Kitchen... As needed    Isosorbide Dinitrate Cr 40 Mg Cr-tabs (Isosorbide dinitrate) .Marland Kitchen... Take one capsule by mouth two times a day Coronary disease is stable at this time. EKG is done and reviewed by me today.  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  There is no significant change.  There is very mild sinus bradycardia.  I considered whether his beta blocker should be changed.  I have not changed the dose.  Orders: EKG w/ Interpretation (93000)  Problem # 5:  CHRONIC SYSTOLIC HEART FAILURE (ICD-428.22)  His updated medication list for this problem includes:    Carvedilol 6.25 Mg Tabs (Carvedilol) .Marland Kitchen... Take one tablet by mouth twice a day    Furosemide 40 Mg Tabs (Furosemide) .Marland Kitchen... Take 1 tablet two times a day extra as needed    Aspirin Ec 325 Mg Tbec (Aspirin) .Marland Kitchen... Take one tablet by mouth daily    Plavix 75 Mg Tabs (Clopidogrel bisulfate) .Marland Kitchen... 1 by mouth daily    Nitrostat 0.4 Mg Subl (Nitroglycerin) .Marland Kitchen... As needed    Isosorbide Dinitrate Cr 40 Mg Cr-tabs (Isosorbide dinitrate) .Marland Kitchen... Take one capsule by mouth two times a day    Metolazone 2.5 Mg Tabs (Metolazone) .Marland Kitchen... Take one tablet by mouth daily as directed I have him on maximum medications that he can tolerate at this time.  Patient Instructions: 1)  Hold AM dose of Clonidine, take evening dose only 2)  Hold AM dose of Hydralazine, take evening dose only 3)  Follow up in 7-10 days--Tue 8/23 at 10:15

## 2010-06-11 NOTE — Assessment & Plan Note (Signed)
Summary: f85m  Medications Added ALLOPURINOL 300 MG TABS (ALLOPURINOL) Take one tablet by mouth once daily along with 100mg  tab ALLOPURINOL 100 MG TABS (ALLOPURINOL) Take 1 tablet by mouth once a day along with 300mg  tab      Allergies Added:   Visit Type:  Follow-up Referring Provider:  Zenovia Jordan, MD Primary Provider:  Jacques Navy MD  CC:  CHF and dizziness.  History of Present Illness: The patient is seen for followup his volume status and his dizziness.  Over time it has been very difficult to know exactly what his spells have been.  There volume component with some orthostasis.  There are other neurologic issues.  However there is a vertigo component.  Patient tells me that he did quite well since the last visit other than a few episodes.  The episodes seem to happen when he was either turning around or someone said hello to him from behind and he turned his head.  If he moves slowly he does fine.  Current Medications (verified): 1)  Carvedilol 6.25 Mg Tabs (Carvedilol) .... Take One Tablet By Mouth Twice A Day 2)  Furosemide 40 Mg Tabs (Furosemide) .... Take 1 Tablet Two Times A Day Extra As Needed 3)  Klor-Con M20 20 Meq  Tbcr (Potassium Chloride Crys Cr) .Marland Kitchen.. 1 By Mouth Two Times A Day 4)  Prilosec Otc 20 Mg Tbec (Omeprazole Magnesium) .... Take One Tablet By Mouth Once Daily. 5)  Aspirin Ec 325 Mg Tbec (Aspirin) .... Take One Tablet By Mouth Daily 6)  Allopurinol 300 Mg Tabs (Allopurinol) .... Take One Tablet By Mouth Once Daily Along With 100mg  Tab 7)  Restasis 0.05 %  Emul (Cyclosporine) .... One Drop Each Eye Two Times A Day 8)  Ocuvite Preservision  Tabs (Multiple Vitamins-Minerals) .Mar[place] Kitchen.. 1 Two Times A Day 9)  Flomax 0.4 Mg Xr24h-Cap (Tamsulosin Hcl) .... Take One Tablet By Mouth Once Daily. 10)  Nasal Moisturizing Spray 0.65 % Soln (Saline) .... Prn 11)  Alprazolam 0.25 Mg Tabs (Alprazolam) .... As Needed 12)  Plavix 75 Mg Tabs (Clopidogrel Bisulfate) .[place].. 1 By  Mouth Daily 13)  Nitrostat 0.4 Mg Subl (Nitroglycerin) .... As Needed 14)  Acetaminophen 325 Mg  Tabs (Acetaminophen) .... As Needed 15)  Isosorbide Dinitrate Cr 40 Mg Cr-Tabs (Isosorbide Dinitrate) .... Take One Capsule Every Evening, Hold Am Dose 16)  Hydralazine Hcl 100 Mg Tabs (Hydralazine Hcl) .... 3/4 Tablet Every Evening, Hold Am Dose 17)  Gabapentin 300 Mg Caps (Gabapentin) .... Take 1 Capsule By Mouth Three Times A Day 18)  Metolazone 2.5 Mg Tabs (Metolazone) .... Take One Tablet By Mouth Daily As Directed 19)  Colcrys 0.6 Mg Tabs (Colchicine) .[place].. 1 Tab Three Times A Day As Needed 20)  Multivitamins   Tabs (Multiple Vitamin) .... Once Daily 21)  Mucinex D 60-600 Mg Xr12h-Tab (Pseudoephedrine-Guaifenesin) .... Daily As Needed Fofr Thick, Sticky Mucus 22)  Antivert 25 Mg Tabs (Meclizine Hcl) .Mar[place] Kitchen.. 1 By Mouth Every 4 Hours As Needed For Dizzy Symptoms 23)  Fluticasone Propionate 50 Mcg/act Susp (Fluticasone Propionate) .... 2 Spray/side Once Daily 24)  Tramadol Hcl 50 Mg Tabs (Tramadol Hcl) .[place].. 1-2 By Mouth Q 6 Hrs As Needed 25)  Bethanechol Chloride 25 Mg Tabs (Bethanechol Chloride) .... Three Times A Day 26)  Antivert 25 Mg Tabs (Meclizine Hcl) .... Uad 27)  Allopurinol 100 Mg Tabs (Allopurinol) .... Take 1 Tablet By Mouth Once A Day Along With 300mg  Tab  Allergies (verified): 1)  !  Cortisone 2)  ! Augmentin 3)  ! Nsaids  Past History:  Past Medical History: UCD Polio -70 y/o  CABG 19 Coronary artery disease..catheterization 2006 new occlusion vein graft  / myoview..01/2006..inferior scar ..mild peri-infarct ischemia  /  non-STEMI April 03, 2009...Marland KitchenMarland Kitchen catheterization April 07, 2009... severe native disease... LIMA to the LAD patent, SVG to OM patent but sequential grafts to the posterior lateral of the OM and then the posterior lateral of the right occluded, SVG to diagonal occluded SVG to the right coronary artery occluded.... this was unchanged since 2006...  /    hospitalization...1.2011.Marland Kitchen elevated troponin/CHF.Marland Kitchen no work-up ischemic cardiomyopathy ICD  placed  05/12/2009 EF 40%  echo..01/2006..inferior/posterior akinesis /  30% echo... June, 2010... technically limited study A/C systolic [facility]..diuresed Combative in hospital  05/2009...treated  haldol..... Carotid aretery disease...doppler..04/2007.Marland Kitchen0-39% RICA.Marland Kitchen60-79%  LICA  /   Doppler... March 21, 2009... 0-39% R. ICA, 60-79% LICA... unchanged he is no rebound Abdominal bruit but no abdominal aortic aneurysm and no renal artery stenosis Grief over wife having severe Alzheimer's bradycardia mild not symptomatic Gout.Marland Kitchenallopurinol and p.r.n. colchicine Hypertension Nephrolithiasis, hx of Prostatitis, hx of  Hyperlipidemia.Marland Kitchenintolerant to all cholesterol medications Cholelithiasis Chronic kidney disease.. Dr. 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 removed at [facility] in the past Aortic root dilitation...mild.. Mild musculoskeletal back pain Renal and liver cysts on CT scan Chronic mild dizziness   worse.. July, 2011 Augmentin allergy. swelling  tongue Renal insufficiency...  Orthostatic dizziness           Cardiology - Myrtis Ser          GU - Darvin Neighbours          Derm - Dr. Mayford Knife in [place]          Vasc Surgeon - Madilyn Fireman          GI - Yancey Flemings         GS - Gross  Review of Systems       Patient denies fever, chills, headache, sweats, rash, change in vision, change in hearing, chest pain, cough, nausea vomiting, urinary symptoms.  All other systems are reviewed and are negative.  Vital Signs:  Patient profile:   70 year old male Height:      71 inches Weight:      250 pounds BMI:     34.99 Pulse rate:   70 / minute BP sitting:   154 / 80  (left arm) Cuff size:   regular  Vitals Entered By: Hardin Negus, RMA (March 05, 2010 10:02 AM)  Physical Exam  General:  patient looks great  today. Eyes:  no xanthelasma. Neck:  no jugular venous distention. Lungs:  lungs are clear.  Respiratory effort is nonlabored. Heart:  cardiac exam reveals S1 and S2.  No clicks or significant murmurs. Abdomen:  abdomen is soft. Extremities:  no peripheral edema. Psych:  Patient is oriented to person time and place affect is normal.    ICD Specifications Following MD:  Lewayne Bunting, MD     ICD Vendor:  [employer]     ICD Model Number:  [id number]     ICD Serial Number:  [id number] ICD DOI:  05/12/2009     ICD Implanting MD:  Lewayne Bunting, MD  Lead 1:    Location: RV     DOI: 05/12/2009     Model #: 8119     Serial #: [id number]     Status: active  ICD Follow Up ICD Dependent:  No      Episodes Coumadin:  No  Brady Parameters Mode VVI     Lower Rate Limit:  40      Tachy Zones VF:  240     VT:  200     VT1:  173     Impression & Recommendations:  Problem # 1:  DIZZINESS (ICD-780.4)  today I believe that there is a vertigo component as outlined. he will try not to do any rotating type of motion in a rapid fashion.  Problem # 2:  EDEMA (ICD-782.3) Volume status is stable.  No change in therapy.  Problem # 3:  CAD (ICD-414.00)  His updated medication list for this problem includes:    Carvedilol 6.25 Mg Tabs (Carvedilol) .Marland Kitchen... Take one tablet by mouth twice a day    Aspirin Ec 325 Mg Tbec (Aspirin) .Marland Kitchen... Take one tablet by mouth daily    Plavix 75 Mg Tabs (Clopidogrel bisulfate) .Marland Kitchen... 1 by mouth daily    Nitrostat 0.4 Mg Subl (Nitroglycerin) .Marland Kitchen... As needed    Isosorbide Dinitrate Cr 40 Mg Cr-tabs (Isosorbide dinitrate) .Marland Kitchen... Take one capsule every evening, hold am dose Coronary disease is stable.  No change in therapy.  Patient Instructions: 1)  Your physician recommends that you schedule a follow-up appointment in: 6 weeks with Dr. Myrtis Ser. Dr. Ladona Ridgel in Jan./12 2)  Your physician recommends that you continue on your current medications as directed. Please refer to the Current Medication  list given to you today.

## 2010-06-11 NOTE — Assessment & Plan Note (Signed)
Summary: f/u - continued dizzyness / SD   Vital Signs:  Patient profile:   70 year old male Height:      71 inches Weight:      244.50 pounds BMI:     34.22 O2 Sat:      96 % on Room air Temp:     98.3 degrees F oral Pulse rate:   62 / minute BP supine:   132 / 80 BP sitting:   122 / 72  (left arm) BP standing:   112 / 58 Cuff size:   large  Vitals Entered By: Zella Ball Ewing CMA Duncan Dull) (November 19, 2009 1:46 PM)  O2 Flow:  Room air CC: followup, continued dizzy/RE   Primary Care Provider:  Jacques Navy MD  CC:  followup and continued dizzy/RE.  History of Present Illness: here to f/u after being seen in the ER after dizziness and fall;  seen in ER, tx with valium limited rx, but then fell again, saw Dr Felicity Coyer july 6 with renal panel (see emr) with improved bun/cr from may 2011;  since then had one episode nasal d/c with pink  - not BRB;  both episodes of fall starts with dizziness/off balance  - denies lightheaded or vertigo;  has known macular degeneration with vision problem;  also recently had a different fall may 6 with right knee giving out getting in to a vehicle  - tx for gout in the knee, had inpt rehab afte initial hospn and knee surgury per pt;  he actually checked his own BP after each fall with SBP in the 120's and just does not think it is low before fall;  no recent fever, ST, cough, CP, sob,  wheezing, orthopnea, PND or worsening LE edema;  in fact lost 1 lb this am (check every am, and down 1 lb from the day prior);  also denies bowel change or blood, no bladder change.  Is currently on plavix, no obvious bleeding or bruising.  On flomax per Dr Myrtis Ser "some time back" but no falls or dizzy from this in the past.  Overall good compliacne with meds, good tolerance.  has hx of polio.  No known peripheral neuropathy, or perpipheral vascular disease or hx of cva.  No ongoing back problems or known metastatic melanoma (tx in 1982);  last hosp'd for CV jan 2011 with CHF, had  pacemaker/defib dec 2010.  Admits to mild depressive symtpoms without suicidal ideation, but declines treatment with SSRI.  Wants to focus on "inner ear " today.  Needs right knee replacement but he has put off so far.  No numb feeling to legs.  Normally walks with walker.  Lives alone.  has not consented to pain med for the right knee so far.  Had hospn in may - [place], cbc abd sugar normal.  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  (ICD-311) 2)  Hypotension, Orthostatic  (ICD-458.0) 3)  Dizziness  (ICD-780.4) 4)  Osteoarthritis, Knee, Right  (ICD-715.96) 5)  Automatic Implantable Cardiac Defibrillator Situ  (ICD-V45.02) 6)  Jerking Sensation in His Leg and Body  () 7)  Decreased Mobility  () 8)  Renal Insufficiency  (ICD-588.9) 9)  Edema  (ICD-782.3) 10)  Cad  (ICD-414.00) 11)  Hypokalemia  (ICD-276.8) 12)  Combative Behavior / Hospital / Haldol Used  () 13)  Icd  ()  14)  Chronic Systolic Heart Failure  (ICD-428.22) 15)  Foot Drop, Left  (ICD-736.79) 16)  Aortic Root Dilitation  () 17)  Dizziness  (ICD-780.4) 18)  Carotid Artery Disease  (ICD-433.10) 19)  Blurred Vision  () 20)  Venous Insufficiency, Legs  (ICD-459.81) 21)  Tick Bite  (ICD-E906.4) 22)  Back Pain  (ICD-724.5) 23)  Gerd  (ICD-530.81) 24)  Bradycardia  (ICD-427.89) 25)  Cholelithiasis  (ICD-574.20) 26)  Hyperlipidemia  (ICD-272.4) 27)  Peripheral Vascular Disease  (ICD-443.9) 28)  Hx of Malignant Melanoma Skin Trunk Except Scrotum  (ICD-172.5) 29)  Cardiomyopathy, Ischemic  (ICD-414.8) 30)  Poliomyelitis, Hx of  (ICD-V12.02) 31)  Nephrolithiasis, Hx of  (ICD-V13.01) 32)  Hypertension  (ICD-401.9) 33)  Gout  (ICD-274.9)  Medications Prior to Update: 1)  Carvedilol 6.25 Mg Tabs (Carvedilol) .... Take One Tablet By Mouth Twice A Day 2)  Furosemide 40 Mg Tabs (Furosemide) .... Take  2 Tablets in Am 1 Tab in Pm 3)  Klor-Con M20 20 Meq  Tbcr (Potassium Chloride Crys Cr) .Marland Kitchen.. 1 By Mouth Two Times A Day 4)  Prilosec Otc 20 Mg Tbec (Omeprazole Magnesium) .... Take One Tablet By Mouth Once Daily. 5)  Aspirin Ec 325 Mg Tbec (Aspirin) .... Take One Tablet By Mouth Daily 6)  Bethanechol Chloride 25 Mg Tabs (Bethanechol Chloride) .... Three Times A Day 7)  Allopurinol 300 Mg Tabs (Allopurinol) .... Take One Tablet By Mouth Once Daily. 8)  Restasis 0.05 %  Emul (Cyclosporine) .... One Drop Each Eye Two Times A Day 9)  Ocuvite Preservision  Tabs (Multiple Vitamins-Minerals) .Marland Kitchen.. 1 Two Times A Day 10)  Flomax 0.4 Mg Xr24h-Cap (Tamsulosin Hcl) .... Take One Tablet By Mouth Once Daily. 11)  Nasal Moisturizing Spray 0.65 % Soln (Saline) .... Prn 12)  Alprazolam 0.25 Mg Tabs (Alprazolam) .... As Needed 13)  Plavix 75 Mg Tabs (Clopidogrel Bisulfate) .Marland Kitchen.. 1 By Mouth Daily 14)  Nitrostat 0.4 Mg Subl (Nitroglycerin) .... As Needed 15)  Acetaminophen 325 Mg  Tabs (Acetaminophen) .... As Needed 16)  Clonidine Hcl 0.1 Mg Tabs (Clonidine Hcl) .... One By Mouth Three Times A Day 17)  Isosorbide Mononitrate Cr 60 Mg Xr24h-Tab (Isosorbide Mononitrate) .... One By Mouth Three Times A Day 18)  Hydralazine Hcl 100 Mg Tabs (Hydralazine Hcl) .... 3/4 Tablet Three Times A Day 19)  Gabapentin 300 Mg Caps (Gabapentin) .... Twice  Times A Day 20)  Metolazone 2.5 Mg Tabs (Metolazone) .... Take One Tablet By Mouth Daily As Directed 21)  Colcrys 0.6 Mg Tabs (Colchicine) .Marland Kitchen.. 1 Tab Three Times A Day As Needed 22)  Isosorbide Dinitrate 10 Mg Tabs (Isosorbide Dinitrate) .... Take One Capsule By Mouth Three Times A Day 23)  Multivitamins   Tabs (Multiple Vitamin) .... Once Daily 24)  Mucinex D 60-600 Mg Xr12h-Tab (Pseudoephedrine-Guaifenesin) .... Daily 25)  Dramamine 50 Mg Tabs (Dimenhydrinate) .... Take 1 By Mouth Once Daily 26)  Antivert 25 Mg Tabs (Meclizine Hcl) .Marland Kitchen.. 1 By Mouth Every 4 Hours As Needed For  Dizzy Symptoms  Current Medications (verified): 1)  Carvedilol 6.25 Mg Tabs (Carvedilol) .... Take One Tablet By Mouth Twice A Day 2)  Furosemide 40 Mg Tabs (Furosemide) .... Take 2 Tablets in Am 1 Tab in Pm 3)  Klor-Con M20 20 Meq  Tbcr (Potassium Chloride Crys Cr) .Marland Kitchen.. 1 By Mouth Two Times A Day 4)  Prilosec Otc 20 Mg Tbec (Omeprazole Magnesium) .... Take One Tablet By Mouth Once Daily. 5)  Aspirin Ec 325  Mg Tbec (Aspirin) .... Take One Tablet By Mouth Daily 6)  Bethanechol Chloride 25 Mg Tabs (Bethanechol Chloride) .... Three Times A Day 7)  Allopurinol 300 Mg Tabs (Allopurinol) .... Take One Tablet By Mouth Once Daily. 8)  Restasis 0.05 %  Emul (Cyclosporine) .... One Drop Each Eye Two Times A Day 9)  Ocuvite Preservision  Tabs (Multiple Vitamins-Minerals) .Marland Kitchen.. 1 Two Times A Day 10)  Flomax 0.4 Mg Xr24h-Cap (Tamsulosin Hcl) .... Take One Tablet By Mouth Once Daily. 11)  Nasal Moisturizing Spray 0.65 % Soln (Saline) .... Prn 12)  Alprazolam 0.25 Mg Tabs (Alprazolam) .... As Needed 13)  Plavix 75 Mg Tabs (Clopidogrel Bisulfate) .Marland Kitchen.. 1 By Mouth Daily 14)  Nitrostat 0.4 Mg Subl (Nitroglycerin) .... As Needed 15)  Acetaminophen 325 Mg  Tabs (Acetaminophen) .... As Needed 16)  Clonidine Hcl 0.1 Mg Tabs (Clonidine Hcl) .... One By Mouth Three Times A Day 17)  Isosorbide Mononitrate Cr 60 Mg Xr24h-Tab (Isosorbide Mononitrate) .... One By Mouth Three Times A Day 18)  Hydralazine Hcl 100 Mg Tabs (Hydralazine Hcl) .... 3/4 Tablet Three Times A Day 19)  Gabapentin 300 Mg Caps (Gabapentin) .... Twice  Times A Day 20)  Metolazone 2.5 Mg Tabs (Metolazone) .... Take One Tablet By Mouth Daily As Directed 21)  Colcrys 0.6 Mg Tabs (Colchicine) .Marland Kitchen.. 1 Tab Three Times A Day As Needed 22)  Isosorbide Dinitrate 10 Mg Tabs (Isosorbide Dinitrate) .... Take One Capsule By Mouth Three Times A Day 23)  Multivitamins   Tabs (Multiple Vitamin) .... Once Daily 24)  Mucinex D 60-600 Mg Xr12h-Tab  (Pseudoephedrine-Guaifenesin) .... Daily 25)  Dramamine 50 Mg Tabs (Dimenhydrinate) .... Take 1 By Mouth Once Daily 26)  Antivert 25 Mg Tabs (Meclizine Hcl) .Marland Kitchen.. 1 By Mouth Every 4 Hours As Needed For Dizzy Symptoms 27)  Fexofenadine Hcl 180 Mg Tabs (Fexofenadine Hcl) .Marland Kitchen.. 1po Once Daily As Needed 28)  Fluticasone Propionate 50 Mcg/act Susp (Fluticasone Propionate) .... 2 Spray/side Once Daily 29)  Tramadol Hcl 50 Mg Tabs (Tramadol Hcl) .Marland Kitchen.. 1-2 By Mouth Q 6 Hrs As Needed  Allergies (verified): 1)  ! Cortisone 2)  ! Augmentin 3)  ! Nsaids  Past History:  Past Medical History: Last updated: 11/12/2009 UCD Polio -70 y/o  CABG 55 Coronary artery disease..catheterization 2006 new occlusion vein graft  / myoview..01/2006..inferior scar ..mild peri-infarct ischemia  /  non-STEMI April 03, 2009...Marland KitchenMarland Kitchen catheterization April 07, 2009... severe native disease... LIMA to the LAD patent, SVG to OM patent but sequential grafts to the posterior lateral of the OM and then the posterior lateral of the right occluded, SVG to diagonal occluded SVG to the right coronary artery occluded.... this was unchanged since 2006...  /   hospitalization...1.2011.Marland Kitchen elevated troponin/CHF.Marland Kitchen no work-up ischemic cardiomyopathy ICD  placed  05/12/2009 EF 40%  echo..01/2006..inferior/posterior akinesis /  30% echo... June, 2010... technically limited study A/C systolic [facility]2009..diuresed Combative in hospital  05/2009...treated  haldol... Carotid aretery disease...doppler..04/2007.Marland Kitchen0-39% RICA.Marland Kitchen60-79%  LICA  /   Doppler... March 21, 2009... 0-39% R. ICA, 60-79% LICA... unchanged he is no rebound Abdominal bruit but no abdominal aortic aneurysm and no renal artery stenosis Grief over wife having severe Alzheimer's bradycardia mild not symptomatic Gout.Marland Kitchenallopurinol and p.r.n. colchicine Hypertension Nephrolithiasis, hx of Prostatitis, hx of  Hyperlipidemia.Marland Kitchenintolerant to all cholesterol  medications Cholelithiasis Chronic kidney disease.. Dr. 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 removed at [facility] in the past Aortic root dilitation...mild.. Mild  musculoskeletal back pain Renal and liver cysts on CT scan Chronic mild dizziness Augmentin allergy with swelling of the tongue Renal insufficiency... function varies  volume overloaded.  June 03, 2009... hospital discharge... BUN 30 / creatinine 1.67           Cardiology - Myrtis Ser          GU - Ron Earlene Plater          Derm - Dr. Mayford Knife in [place]          Vasc Surgeon - Madilyn Fireman          GI - Yancey Flemings         GS - Gross  Past Surgical History: Last updated: June 12, 2007 arthoroscopy-right knee nephrolithiasis surgery - August '87 brain tumor - excision August '99 melanoma excision from back - June '82 Dr. Pixie Casino [facility] Coronary artery bypass graft- LIMA to LAD; SVG to dx, OM, Cx, RCA  '93 Lap Chole  October '08  Family History: Last updated: 2007-06-12 father - deceased, accidental death mother- CAD/MI Neg - colon, prostate cancer; DM  Social History: Last updated: 06/12/07 HSG, some college work Married '65 1 son retired - wharehouse clerk [employer]  Review of Systems  The patient denies fever, vision loss, hoarseness, chest pain, syncope, dyspnea on exertion, peripheral edema, prolonged cough, headaches, hemoptysis, abdominal pain, melena, hematochezia, severe indigestion/heartburn, hematuria, muscle weakness, suspicious skin lesions, unusual weight change, abnormal bleeding, enlarged lymph nodes, and angioedema.         all otherwise negative per pt -    Physical Exam  General:  alert and overweight-appearing. , fatigued appearing, nontoxic Head:  normocephalic and atraumatic.   Eyes:  vision grossly intact, pupils equal, and pupils round.   Ears:  R ear normal.  , left tm with mod erythema Nose:  no external deformity  and no nasal discharge.   Mouth:  no gingival abnormalities and pharynx pink and moist.   Neck:  supple and full ROM.   Lungs:  normal respiratory effort and normal breath sounds.   Heart:  normal rate and regular rhythm.   Abdomen:  soft, non-tender, and normal bowel sounds.   Msk:  no joint tenderness and no joint swelling acutely except warm right knee wiht small effusion, wearing soft knee brace Extremities:  chronic 1+ bilat LE edema, no signifcant erythema, ulcers, tender Neurologic:  alert & oriented X3, strength normal in all extremities, sensation intact to light touch, and gait normal except for severe right knee pain to stand and favoring the knee Skin:  color normal and no rashes.   Psych:  dysphoric affect.     Impression & Recommendations:  Problem # 1:  DIZZINESS (ICD-780.4)  His updated medication list for this problem includes:    Dramamine 50 Mg Tabs (Dimenhydrinate) .Marland Kitchen... Take 1 by mouth once daily    Antivert 25 Mg Tabs (Meclizine hcl) .Marland Kitchen... 1 by mouth every 4 hours as needed for dizzy symptoms    Fexofenadine Hcl 180 Mg Tabs (Fexofenadine hcl) .Marland Kitchen... 1po once daily as needed  does appear to have some left middle ear issue by exam,  ok for mucinex two times a day, add allegra once daily, and flonase as needed, consider valsalva maneuver, nasal saline lavage, and/or ent eval  But , also orthostatic today  (see below)  note:  total time for history, physical exam, reviewing hospital Echart, and d/w pt eval and treatment is > 45 min  Problem # 2:  HYPOTENSION, ORTHOSTATIC (ICD-458.0) probable intravasc depleted (has some LE edema) it seems with recent wt loss on current med regiment including the lasix;  will ask pt to hold the lasix for 4 days, with daily BP and wt checks at home, and f/u with Dr Debby Bud next wk;  to cont the flomax and other meds for now but consider trial off the flomax to asses symptoms off  Problem # 3:  OSTEOARTHRITIS, KNEE, RIGHT (ICD-715.96)  His  updated medication list for this problem includes:    Aspirin Ec 325 Mg Tbec (Aspirin) .Marland Kitchen... Take one tablet by mouth daily    Acetaminophen 325 Mg Tabs (Acetaminophen) .Marland Kitchen... As needed    Tramadol Hcl 50 Mg Tabs (Tramadol hcl) .Marland Kitchen... 1-2 by mouth q 6 hrs as needed with severe pain on standing which certainly does not help his ability to ambulate;  he declines ortho eval at this time, but will accept tramadol as needed   Problem # 4:  RENAL INSUFFICIENCY (ICD-588.9) improved recent, declines f/u labs today  Problem # 5:  PERIPHERAL VASCULAR DISEASE (ICD-443.9) pt denies diagnosis in the EMR, details unclear to me, may need further eval but denies pain on ambulation except for the right knee or other vascular symptoms  Problem # 6:  DEPRESSIVE DISORDER NOT ELSEWHERE CLASSIFIED (ICD-311)  His updated medication list for this problem includes:    Alprazolam 0.25 Mg Tabs (Alprazolam) .Marland Kitchen... As needed d/w pt  - mild to mod situational it seems, I suggested SSRI trial to which he declines  Complete Medication List: 1)  Carvedilol 6.25 Mg Tabs (Carvedilol) .... Take one tablet by mouth twice a day 2)  Furosemide 40 Mg Tabs (Furosemide) .... Take 2 tablets in am 1 tab in pm 3)  Klor-con M20 20 Meq Tbcr (Potassium chloride crys cr) .Marland Kitchen.. 1 by mouth two times a day 4)  Prilosec Otc 20 Mg Tbec (Omeprazole magnesium) .... Take one tablet by mouth once daily. 5)  Aspirin Ec 325 Mg Tbec (Aspirin) .... Take one tablet by mouth daily 6)  Bethanechol Chloride 25 Mg Tabs (Bethanechol chloride) .... Three times a day 7)  Allopurinol 300 Mg Tabs (Allopurinol) .... Take one tablet by mouth once daily. 8)  Restasis 0.05 % Emul (Cyclosporine) .... One drop each eye two times a day 9)  Ocuvite Preservision Tabs (Multiple vitamins-minerals) .Marland Kitchen.. 1 two times a day 10)  Flomax 0.4 Mg Xr24h-cap (Tamsulosin hcl) .... Take one tablet by mouth once daily. 11)  Nasal Moisturizing Spray 0.65 % Soln (Saline) .... Prn 12)   Alprazolam 0.25 Mg Tabs (Alprazolam) .... As needed 13)  Plavix 75 Mg Tabs (Clopidogrel bisulfate) .Marland Kitchen.. 1 by mouth daily 14)  Nitrostat 0.4 Mg Subl (Nitroglycerin) .... As needed 15)  Acetaminophen 325 Mg Tabs (Acetaminophen) .... As needed 16)  Clonidine Hcl 0.1 Mg Tabs (Clonidine hcl) .... One by mouth three times a day 17)  Isosorbide Mononitrate Cr 60 Mg Xr24h-tab (Isosorbide mononitrate) .... One by mouth three times a day 18)  Hydralazine Hcl 100 Mg Tabs (Hydralazine hcl) .... 3/4 tablet three times a day 19)  Gabapentin 300 Mg Caps (Gabapentin) .... Twice  times a day 20)  Metolazone 2.5 Mg Tabs (Metolazone) .... Take one tablet by mouth daily as directed 21)  Colcrys 0.6 Mg Tabs (Colchicine) .Marland Kitchen.. 1 tab three times a day as needed 22)  Isosorbide Dinitrate 10 Mg Tabs (Isosorbide dinitrate) .... Take one capsule by mouth three times a day 23)  Multivitamins Tabs (Multiple  vitamin) .... Once daily 24)  Mucinex D 60-600 Mg Xr12h-tab (Pseudoephedrine-guaifenesin) .... Daily 25)  Dramamine 50 Mg Tabs (Dimenhydrinate) .... Take 1 by mouth once daily 26)  Antivert 25 Mg Tabs (Meclizine hcl) .Marland Kitchen.. 1 by mouth every 4 hours as needed for dizzy symptoms 27)  Fexofenadine Hcl 180 Mg Tabs (Fexofenadine hcl) .Marland Kitchen.. 1po once daily as needed 28)  Fluticasone Propionate 50 Mcg/act Susp (Fluticasone propionate) .... 2 spray/side once daily 29)  Tramadol Hcl 50 Mg Tabs (Tramadol hcl) .Marland Kitchen.. 1-2 by mouth q 6 hrs as needed  Patient Instructions: 1)  Please take all new medications as prescribed 2)  Continue all previous medications as before this visit , except hold on taking the lasix completely for 5 days, then re-start 3)  Please schedule an appointment with your primary doctor Next Mon  July 18, or sooner if needed Prescriptions: TRAMADOL HCL 50 MG TABS (TRAMADOL HCL) 1-2 by mouth q 6 hrs as needed  #120 x 2   Entered and Authorized by:   Corwin Levins MD   Signed by:   Corwin Levins MD on 11/19/2009    Method used:   Print then Give to Patient   RxID:   [id number] FLUTICASONE PROPIONATE 50 MCG/ACT SUSP (FLUTICASONE PROPIONATE) 2 spray/side once daily  #1 x 11   Entered and Authorized by:   Corwin Levins MD   Signed by:   Corwin Levins MD on 11/19/2009   Method used:   Print then Give to Patient   RxID:   [id number] FEXOFENADINE HCL 180 MG TABS (FEXOFENADINE HCL) 1po once daily as needed  #30 x 11   Entered and Authorized by:   Corwin Levins MD   Signed by:   Corwin Levins MD on 11/19/2009   Method used:   Print then Give to Patient   RxID:   [id number]

## 2010-06-11 NOTE — Assessment & Plan Note (Signed)
Summary: FU PER DR JOHN/NWS   Vital Signs:  Patient profile:   71 year old male Height:      71 inches Weight:      245 pounds BMI:     34.29 O2 Sat:      94 % on Room air Temp:     97.9 degrees F oral Pulse rate:   55 / minute BP sitting:   120 / 72  (left arm) Cuff size:   large  Vitals Entered By: Bill Salinas CMA (November 24, 2009 2:00 PM)  O2 Flow:  Room air  Primary Care Provider:  Jacques Navy MD   History of Present Illness: Patient presents for follow-up. He recently saw Dr. Jonny Ruiz for problems of weakness and dizziness. ON exam he was found to be ortho-static. Dr. Jonny Ruiz stopped his furosemide. He was also prescribed several products for allergic/vasomotor rhinnitis. Since his visit he has been feeling better.   Did a detailed review of all the patients medications: bethanecol, isosorbide dinitrate and other duplicate medications were removed from the list. See up-dated medication record.   Current Medications (verified): 1)  Carvedilol 6.25 Mg Tabs (Carvedilol) .... Take One Tablet By Mouth Twice A Day 2)  Furosemide 40 Mg Tabs (Furosemide) .... Take 2 Tablets in Am 1 Tab in Pm 3)  Klor-Con M20 20 Meq  Tbcr (Potassium Chloride Crys Cr) .Marland Kitchen.. 1 By Mouth Two Times A Day 4)  Prilosec Otc 20 Mg Tbec (Omeprazole Magnesium) .... Take One Tablet By Mouth Once Daily. 5)  Aspirin Ec 325 Mg Tbec (Aspirin) .... Take One Tablet By Mouth Daily 6)  Bethanechol Chloride 25 Mg Tabs (Bethanechol Chloride) .... Three Times A Day 7)  Allopurinol 300 Mg Tabs (Allopurinol) .... Take One Tablet By Mouth Once Daily. 8)  Restasis 0.05 %  Emul (Cyclosporine) .... One Drop Each Eye Two Times A Day 9)  Ocuvite Preservision  Tabs (Multiple Vitamins-Minerals) .Marland Kitchen.. 1 Two Times A Day 10)  Flomax 0.4 Mg Xr24h-Cap (Tamsulosin Hcl) .... Take One Tablet By Mouth Once Daily. 11)  Nasal Moisturizing Spray 0.65 % Soln (Saline) .... Prn 12)  Alprazolam 0.25 Mg Tabs (Alprazolam) .... As Needed 13)  Plavix 75  Mg Tabs (Clopidogrel Bisulfate) .Marland Kitchen.. 1 By Mouth Daily 14)  Nitrostat 0.4 Mg Subl (Nitroglycerin) .... As Needed 15)  Acetaminophen 325 Mg  Tabs (Acetaminophen) .... As Needed 16)  Clonidine Hcl 0.1 Mg Tabs (Clonidine Hcl) .... One By Mouth Three Times A Day 17)  Isosorbide Mononitrate Cr 60 Mg Xr24h-Tab (Isosorbide Mononitrate) .... One By Mouth Three Times A Day 18)  Hydralazine Hcl 100 Mg Tabs (Hydralazine Hcl) .... 3/4 Tablet Three Times A Day 19)  Gabapentin 300 Mg Caps (Gabapentin) .... Twice  Times A Day 20)  Metolazone 2.5 Mg Tabs (Metolazone) .... Take One Tablet By Mouth Daily As Directed 21)  Colcrys 0.6 Mg Tabs (Colchicine) .Marland Kitchen.. 1 Tab Three Times A Day As Needed 22)  Isosorbide Dinitrate 10 Mg Tabs (Isosorbide Dinitrate) .... Take One Capsule By Mouth Three Times A Day 23)  Multivitamins   Tabs (Multiple Vitamin) .... Once Daily 24)  Mucinex D 60-600 Mg Xr12h-Tab (Pseudoephedrine-Guaifenesin) .... Daily 25)  Dramamine 50 Mg Tabs (Dimenhydrinate) .... Take 1 By Mouth Once Daily 26)  Antivert 25 Mg Tabs (Meclizine Hcl) .Marland Kitchen.. 1 By Mouth Every 4 Hours As Needed For Dizzy Symptoms 27)  Fexofenadine Hcl 180 Mg Tabs (Fexofenadine Hcl) .Marland Kitchen.. 1po Once Daily As Needed 28)  Fluticasone Propionate 50  Mcg/act Susp (Fluticasone Propionate) .... 2 Spray/side Once Daily 29)  Tramadol Hcl 50 Mg Tabs (Tramadol Hcl) .Marland Kitchen.. 1-2 By Mouth Q 6 Hrs As Needed 30)  Diazepam 5 Mg Tabs (Diazepam) .Marland Kitchen.. 1 Tab Every 12 Hours As Needed  Allergies (verified): 1)  ! Cortisone 2)  ! Augmentin 3)  ! Nsaids PMH-FH-SH reviewed-no changes except otherwise noted  Review of Systems       The patient complains of difficulty walking.  The patient denies anorexia, weight loss, weight gain, chest pain, prolonged cough, headaches, abdominal pain, severe indigestion/heartburn, unusual weight change, and abnormal bleeding.    Physical Exam  General:  overweight white male in a w/c Head:  normocephalic and atraumatic.     Lungs:  normal respiratory effort and normal breath sounds.   Heart:  normal rate and regular rhythm.   Msk:  right knee without effusion or redness Neurologic:  alert & oriented X3, cranial nerves II-XII intact, and strength normal in all extremities.     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 reviewed all meds including potential causes of hypotension. See CHF below.   Problem # 2:  CHRONIC SYSTOLIC HEART FAILURE (ICD-428.22) Patient on a complex regimen under the direction of Dr. Myrtis Ser. He has no signs of failure today.  Plan - resume lasix at 40mg  two times a day           instructed in the rule of "2's"           advised him that any med changes needed to be reviewed by myself or Dr. Myrtis Ser, the later for any cardiac related meds. One cook/omelette  (greater than 50% 30 minute visit spent on education and counseling)  His updated medication list for this problem includes:    Carvedilol 6.25 Mg Tabs (Carvedilol) .Marland Kitchen... Take one tablet by mouth twice a day    Furosemide 40 Mg Tabs (Furosemide) .Marland Kitchen... Take 1 tablet two times a day    Aspirin Ec 325 Mg Tbec (Aspirin) .Marland Kitchen... Take one tablet by mouth daily    Plavix 75 Mg Tabs (Clopidogrel bisulfate) .Marland Kitchen... 1 by mouth daily    Metolazone 2.5 Mg Tabs (Metolazone) .Marland Kitchen... Take one tablet by mouth daily as directed  Complete Medication List: 1)  Carvedilol 6.25 Mg Tabs (Carvedilol) .... Take one tablet by mouth twice a day 2)  Furosemide 40 Mg Tabs (Furosemide) .... Take 1 tablet two times a day 3)  Klor-con M20 20 Meq Tbcr (Potassium chloride crys cr) .Marland Kitchen.. 1 by mouth two times a day 4)  Prilosec Otc 20 Mg Tbec (Omeprazole magnesium) .... Take one tablet by mouth once daily. 5)  Aspirin Ec 325 Mg Tbec (Aspirin) .... Take one tablet by mouth daily 6)  Allopurinol 300 Mg Tabs (Allopurinol) .... Take one tablet by mouth once daily. 7)  Restasis 0.05 % Emul (Cyclosporine) .... One drop each  eye two times a day 8)  Ocuvite Preservision Tabs (Multiple vitamins-minerals) .Marland Kitchen.. 1 two times a day 9)  Flomax 0.4 Mg Xr24h-cap (Tamsulosin hcl) .... Take one tablet by mouth once daily. 10)  Nasal Moisturizing Spray 0.65 % Soln (Saline) .... Prn 11)  Alprazolam 0.25 Mg Tabs (Alprazolam) .... As needed 12)  Plavix 75 Mg Tabs (Clopidogrel bisulfate) .Marland Kitchen.. 1 by mouth daily 13)  Nitrostat 0.4 Mg Subl (Nitroglycerin) .... As needed 14)  Acetaminophen 325 Mg Tabs (Acetaminophen) .... As needed 15)  Clonidine Hcl 0.1 Mg Tabs (Clonidine hcl) .Marland KitchenMarland KitchenMarland Kitchen  One by mouth three times a day 16)  Isosorbide Mononitrate Cr 60 Mg Xr24h-tab (Isosorbide mononitrate) .... One by mouth three times a day 17)  Hydralazine Hcl 100 Mg Tabs (Hydralazine hcl) .... 3/4 tablet three times a day 18)  Gabapentin 300 Mg Caps (Gabapentin) .... Twice  times a day 19)  Metolazone 2.5 Mg Tabs (Metolazone) .... Take one tablet by mouth daily as directed 20)  Colcrys 0.6 Mg Tabs (Colchicine) .Marland Kitchen.. 1 tab three times a day as needed 21)  Multivitamins Tabs (Multiple vitamin) .... Once daily 22)  Mucinex D 60-600 Mg Xr12h-tab (Pseudoephedrine-guaifenesin) .... Daily as needed fofr thick, sticky mucus 23)  Antivert 25 Mg Tabs (Meclizine hcl) .Marland Kitchen.. 1 by mouth every 4 hours as needed for dizzy symptoms 24)  Fluticasone Propionate 50 Mcg/act Susp (Fluticasone propionate) .... 2 spray/side once daily 25)  Tramadol Hcl 50 Mg Tabs (Tramadol hcl) .Marland Kitchen.. 1-2 by mouth q 6 hrs as needed  Patient Instructions: 1)  please see revised med list below. Remove anything that is not on this list from your med-box. 2)  Heart failure - doing well today. Resume the furosemide at 40mg  two times a day. Rule of "2"= weigh every day at the same time in the same attire. For a weight gain of 2 or more lbs in 2 days double your AM furosemide for two days. 3)  One cook per omelette - if you have trouble with dizziness or low blood pressure let Dr. Dorthula Rue your  medications. There are many options in regard to stopping or reducing the dose of medications.  4)  For leg swelling you are best to use knee high support hose.

## 2010-06-11 NOTE — Progress Notes (Signed)
Summary: QUESTIONS RE MEDICATION   Phone Note Call from Patient Call back at Home Phone [phone]   Caller: Patient [phone] Reason for Call: Talk to Nurse Summary of Call: PT ON ALLOPURINOL-WAS GIVEN AN RX FOR VALIUM FOR VERTIGO-CAN HE TAKE BOTH OF THOSE TOGETHER? PLS CALL [phone] Initial call taken by: Glynda Jaeger,  October 31, 2009 10:59 AM  Follow-up for Phone Call        10/31/09 11am--pt calling and stating he is having vertigo, for which he has valium--he is also on allopurinol for gout and wants to know if can take together--advised, no noted adverse reaction to taking both at same time--pt agrees--nt Follow-up by: Ledon Snare, RN,  October 31, 2009 11:08 AM

## 2010-06-11 NOTE — Progress Notes (Signed)
Summary: dizziness  Phone Note Call from Patient Call back at [phone]   Summary of Call: Patient left message on triage that he was seen recently for dizziness and continues to struggle with this issue. Patient was given an prescription but continue to experience dizziness. Please call patient at [phone]. Initial call taken by: Lucious Groves,  November 17, 2009 1:14 PM  Follow-up for Phone Call        Pt has taken antivert four times a day w/ no relief. Says dizzyness is only slightly better. Scheduled for office visit thursday for re-eval. He will call back if symptoms become worse or has new problems. Follow-up by: Lamar Sprinkles, CMA,  November 17, 2009 1:58 PM

## 2010-06-11 NOTE — Miscellaneous (Signed)
Summary: Plan of Care & Treatment/[facility]  Plan of Care & Treatment/[facility]   Imported By: Sherian Rein 07/02/2009 09:29:46  _____________________________________________________________________  External Attachment:    Type:   Image     Comment:   External Document

## 2010-06-11 NOTE — Miscellaneous (Signed)
Summary: Device preload  Clinical Lists Changes  Observations: Added new observation of ICDLEADSTAT1: active (05/14/2009 9:25) Added new observation of ICDLEADSER1: [id number] (05/14/2009 9:25) Added new observation of ICDLEADMOD1: 7121  (05/14/2009 9:25) Added new observation of ICDLEADLOC1: RV  (05/14/2009 9:25) Added new observation of ICD IMP MD: Lewayne Bunting, MD  (05/14/2009 9:25) Added new observation of ICDLEADDOI1: 05/12/2009  (05/14/2009 9:25) Added new observation of ICD IMPL DTE: 05/12/2009  (05/14/2009 9:25) Added new observation of ICD SERL#: [id number]  (05/14/2009 9:25) Added new observation of ICD MODL#: [id number]  (05/14/2009 6:29) Added new observation of ICDMANUFACTR: [employer]  (05/14/2009 9:25) Added new observation of ICD MD: Lewayne Bunting, MD  (05/14/2009 9:25)       ICD Specifications Following MD:  Lewayne Bunting, MD     ICD Vendor:  [employer]     ICD Model Number:  [id number]     ICD Serial Number:  [id number] ICD DOI:  05/12/2009     ICD Implanting MD:  Lewayne Bunting, MD  Lead 1:    Location: RV     DOI: 05/12/2009     Model #: [id number]     Serial #: [id number]     Status: active

## 2010-06-11 NOTE — Assessment & Plan Note (Signed)
Summary: 10:15  Medications Added ISOSORBIDE DINITRATE CR 40 MG CR-TABS (ISOSORBIDE DINITRATE) Take one capsule every evening, HOLD AM DOSE      Allergies Added:   Visit Type:  Follow-up Referring Provider:  Zenovia Jordan, MD Primary Provider:  Jacques Navy MD  CC:  orthostatic hypotension.  History of Present Illness: Patient is seen today for follow up of coronary disease left ventricular dysfunction and orthostatic hypotension.  I saw him last December 18, 2009.  He seems to have most of his problems in the morning.  I had put his morning clonidine and hydralazine on hold.  He was definitely feeling better.  He had no dizzy spells.  Today after driving 50 minutes he felt poorly when coming into our office.  He sat down and stood up again and again felt poorly.  He was placed in a wheelchair and within a few minutes he felt better.  In the wheelchair his blood pressure was 126 systolic.  He says that he feels best when his blood pressure is approximately 150 systolic and I suspect that this is true.  I am not able to back off on his diuretics substantially as he has repeated episodes of volume overload.  I have been hesitant to use Mididrine, but this will be considered.  Current Medications (verified): 1)  Carvedilol 6.25 Mg Tabs (Carvedilol) .... Take One Tablet By Mouth Twice A Day 2)  Furosemide 40 Mg Tabs (Furosemide) .... Take 1 Tablet Two Times A Day Extra As Needed 3)  Klor-Con M20 20 Meq  Tbcr (Potassium Chloride Crys Cr) .Marland Kitchen.. 1 By Mouth Two Times A Day 4)  Prilosec Otc 20 Mg Tbec (Omeprazole Magnesium) .... Take One Tablet By Mouth Once Daily. 5)  Aspirin Ec 325 Mg Tbec (Aspirin) .... Take One Tablet By Mouth Daily 6)  Allopurinol 300 Mg Tabs (Allopurinol) .... Take One Tablet By Mouth Once Daily. 7)  Restasis 0.05 %  Emul (Cyclosporine) .... One Drop Each Eye Two Times A Day 8)  Ocuvite Preservision  Tabs (Multiple Vitamins-Minerals) .Marland Kitchen.. 1 Two Times A Day 9)  Flomax 0.4 Mg  Xr24h-Cap (Tamsulosin Hcl) .... Take One Tablet By Mouth Once Daily. 10)  Nasal Moisturizing Spray 0.65 % Soln (Saline) .... Prn 11)  Alprazolam 0.25 Mg Tabs (Alprazolam) .... As Needed 12)  Plavix 75 Mg Tabs (Clopidogrel Bisulfate) .Marland Kitchen.. 1 By Mouth Daily 13)  Nitrostat 0.4 Mg Subl (Nitroglycerin) .... As Needed 14)  Acetaminophen 325 Mg  Tabs (Acetaminophen) .... As Needed 15)  Clonidine Hcl 0.1 Mg Tabs (Clonidine Hcl) .Marland Kitchen.. 1 Tab Every Evening, Hold Am Dose 16)  Isosorbide Dinitrate Cr 40 Mg Cr-Tabs (Isosorbide Dinitrate) .... Take One Capsule By Mouth Two Times A Day 17)  Hydralazine Hcl 100 Mg Tabs (Hydralazine Hcl) .... 3/4 Tablet Every Evening, Hold Am Dose 18)  Gabapentin 300 Mg Caps (Gabapentin) .... Twice  Times A Day 19)  Metolazone 2.5 Mg Tabs (Metolazone) .... Take One Tablet By Mouth Daily As Directed 20)  Colcrys 0.6 Mg Tabs (Colchicine) .Marland Kitchen.. 1 Tab Three Times A Day As Needed 21)  Multivitamins   Tabs (Multiple Vitamin) .... Once Daily 22)  Mucinex D 60-600 Mg Xr12h-Tab (Pseudoephedrine-Guaifenesin) .... Daily As Needed Fofr Thick, Sticky Mucus 23)  Antivert 25 Mg Tabs (Meclizine Hcl) .Marland Kitchen.. 1 By Mouth Every 4 Hours As Needed For Dizzy Symptoms 24)  Fluticasone Propionate 50 Mcg/act Susp (Fluticasone Propionate) .... 2 Spray/side Once Daily 25)  Tramadol Hcl 50 Mg Tabs (Tramadol Hcl) .Marland KitchenMarland KitchenMarland Kitchen  1-2 By Mouth Q 6 Hrs As Needed 26)  Bethanechol Chloride 25 Mg Tabs (Bethanechol Chloride) .... Three Times A Day 27)  Methotrexate 2.5 Mg Tabs (Methotrexate Sodium) .... Uad 28)  Antivert 25 Mg Tabs (Meclizine Hcl) .... Uad  Allergies (verified): 1)  ! Cortisone 2)  ! Augmentin 3)  ! 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 severe native disease... LIMA to  the LAD patent, SVG to OM patent but sequential grafts to the posterior lateral of the OM and then the posterior lateral of the right occluded, SVG to diagonal occluded SVG to the right coronary artery occluded.... this was unchanged since 2006...  /   hospitalization...1.2011.Marland Kitchen elevated troponin/CHF.Marland Kitchen no work-up ischemic cardiomyopathy ICD  placed  05/12/2009 EF 40%  echo..01/2006..inferior/posterior akinesis /  30% echo... June, 2010... technically limited study A/C systolic [facility]..diuresed Combative in hospital  05/2009...treated  haldol... Carotid aretery disease...doppler..04/2007.Marland Kitchen0-39% RICA.Marland Kitchen60-79%  LICA  /   Doppler... March 21, 2009... 0-39% R. ICA, 60-79% LICA... unchanged he is no rebound Abdominal bruit but no abdominal aortic aneurysm and no renal artery stenosis Grief over wife having severe Alzheimer's bradycardia mild not symptomatic Gout.Marland Kitchenallopurinol and p.r.n. colchicine Hypertension Nephrolithiasis, hx of Prostatitis, hx of  Hyperlipidemia.Marland Kitchenintolerant to all cholesterol medications Cholelithiasis Chronic kidney disease.. Dr. 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 [facility] in the past Aortic root dilitation...mild.. Mild musculoskeletal back pain Renal and liver cysts on CT scan Chronic mild dizziness   worse.. July, 2011 Augmentin allergy. swelling  tongue Renal insufficiency... function varies  volume overloaded.  June 03, 2009... hospital discharge... BUN 30 / creatinine 1.67           Cardiology - Myrtis Ser          GU - Darvin Neighbours          Derm - Dr. Mayford Knife in [place]          Vasc Surgeon - Madilyn Fireman          GI - Yancey Flemings         GS - Gross  Review of Systems       Patient denies fever, chills, headache, sweats, rash, change in vision, change in hearing, chest pain, cough, nausea vomiting, urinary symptoms.  All other systems are reviewed and are  negative.  Vital Signs:  Patient profile:   70 year old male Height:      71 inches Weight:      243 pounds BMI:     34.01 Pulse rate:   61 / minute BP sitting:   126 / 70  (right arm) Cuff size:   large  Vitals Entered By: Hardin Negus, RMA (December 30, 2009 9:59 AM)  Physical Exam  General:  patient looked weak when he was standing up as he came into the office.  After sitting in the wheelchair he looks fine. Eyes:  no xanthelasma. Neck:  no jugular venous distention. Lungs:  lungs are clear.  Respiratory effort is unlabored. Heart:  cardiac exam reveals S1-S2.  No clicks or significant murmurs. Abdomen:  abdomen is soft. Extremities:  no peripheral edema.    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  I've chosen to decrease his morning medicines further.  I will consider a morning dose of Midrin in the future.  He will hold his isosorbide dinitrate.  He will the morning dose. The patient took one dose of colchicine today.  It seems very unlikely that this causes spell this morning.  He is continuing on Antivert daily.  I want him to continue this until other changes are stable.  Problem # 2:  RENAL INSUFFICIENCY (ICD-588.9) Chemistry will be checked today.  I need to continue to think about his diuretic dose.  Problem # 3:  CAD (ICD-414.00)  His updated medication list for this problem includes:     Carvedilol 6.25 Mg Tabs (Carvedilol) .Marland Kitchen... Take one tablet by mouth twice a day    Aspirin Ec 325 Mg Tbec (Aspirin) .Marland Kitchen... Take one tablet by mouth daily    Plavix 75 Mg Tabs (Clopidogrel bisulfate) .Marland Kitchen... 1 by mouth daily    Nitrostat 0.4 Mg Subl (Nitroglycerin) .Marland Kitchen... As needed    Isosorbide Dinitrate Cr 40 Mg Cr-tabs (Isosorbide dinitrate) .Marland Kitchen... Take one capsule every evening, hold am dose There is no chest pain.  He is stable.  I will see him for early followup.  Orders: TLB-BMP (Basic Metabolic Panel-BMET) (80048-METABOL)  Patient Instructions: 1)  HOLD AM DOSES OF CLONIDINE, HYDRALAZINE, AND ISOSORBIDE, take PM doses only 2)  Lab today 3)  Follow up in 2 weeks--Tue 9/6 at 3:30

## 2010-06-11 NOTE — Cardiovascular Report (Signed)
Summary: Office Visit   Office Visit   Imported By: Roderic Ovens 08/15/2009 13:40:01  _____________________________________________________________________  External Attachment:    Type:   Image     Comment:   External Document

## 2010-06-11 NOTE — Letter (Signed)
Summary: [facility]   Imported By: Sherian Rein 02/20/2010 11:50:29  _____________________________________________________________________  External Attachment:    Type:   Image     Comment:   External Document

## 2010-06-11 NOTE — Procedures (Signed)
Summary: [facility]    Allergies: 1)  ! Cortisone 2)  ! Augmentin 3)  ! Nsaids   ICD Specifications Following MD:  Lewayne Bunting, MD     ICD Vendor:  [employer]     ICD Model Number:  [id number]     ICD Serial Number:  [id number] ICD DOI:  05/12/2009     ICD Implanting MD:  Lewayne Bunting, MD  Lead 1:    Location: RV     DOI: 05/12/2009     Model #: 8119     Serial #: [id number]     Status: active  ICD Follow Up Remote Check?  No Charge Time:  8.5 seconds     Battery Est. Longevity:  9.3 years ICD Dependent:  No       ICD Device Measurements Right Ventricle:  Amplitude: 12 mV, Impedance: 480 ohms, Threshold: 0.5 V at 0.5 msec  Episodes Coumadin:  No Shock:  0     ATP:  0     Nonsustained:  0     Ventricular Pacing:  4.3%  Brady Parameters Mode VVI     Lower Rate Limit:  40      Tachy Zones VF:  240     VT:  200     VT1:  173     Next Remote Date:  02/26/2010

## 2010-06-11 NOTE — Cardiovascular Report (Signed)
Summary: Office Visit   Office Visit   Imported By: Roderic Ovens 03/13/2010 09:44:09  _____________________________________________________________________  External Attachment:    Type:   Image     Comment:   External Document

## 2010-06-11 NOTE — Cardiovascular Report (Signed)
Summary: Office Visit   Office Visit   Imported By: Roderic Ovens 06/04/2009 11:41:26  _____________________________________________________________________  External Attachment:    Type:   Image     Comment:   External Document

## 2010-06-11 NOTE — Progress Notes (Signed)
Summary: RX  Phone Note Call from Patient Call back at Home Phone [phone]   Summary of Call: Patient is requesting refill of diazepam. I see this as removed from MD at last office visit w/no specific reason. Please adivse.  Initial call taken by: Lamar Sprinkles, CMA,  December 11, 2009 1:36 PM  Follow-up for Phone Call        in th eprocess of cleaning up his med list and eliminating duplicates it looks like valium was collateral damage. OK to restore to med list and refill Rx. x 5. Thanks Follow-up by: Jacques Navy MD,  December 12, 2009 8:21 AM  Additional Follow-up for Phone Call Additional follow up Details #1::        Pt informed, previous rx was for 5mg  1 two times a day. We have never refilled. What qty is ok?  Additional Follow-up by: Lamar Sprinkles, CMA,  December 12, 2009 2:54 PM    Additional Follow-up for Phone Call Additional follow up Details #2::    #60 x 5 Follow-up by: Jacques Navy MD,  December 12, 2009 6:05 PM  New/Updated Medications: DIAZEPAM 5 MG TABS (DIAZEPAM) 1 two times a day as needed Prescriptions: DIAZEPAM 5 MG TABS (DIAZEPAM) 1 two times a day as needed  #60 x 5   Entered by:   Lamar Sprinkles, CMA   Authorized by:   Jacques Navy MD   Signed by:   Lamar Sprinkles, CMA on 12/12/2009   Method used:   Telephoned to ...       [facility]  [address] (retail)       [address]       [place], [place]  [address]       Ph: [phone]       Fax: [phone]   RxID:   [id number]

## 2010-06-11 NOTE — Progress Notes (Signed)
Summary: wound care orders  Phone Note From Other Clinic   Caller: [facility] [phone] Summary of Call: Nurse called stating that pt was D/C home from hospital and has a wound on Left Lower leg. She would like orders to get this done while out at pt's home. Please advise Initial call taken by: Rock Nephew CMA,  June 06, 2009 9:14 AM  Follow-up for Phone Call        ok  Follow-up by: Etta Grandchild MD,  June 06, 2009 11:00 AM     Appended Document: wound care orders Nurse was informed earlier by verbal ok from Dr Posey Rea

## 2010-06-11 NOTE — Letter (Signed)
Summary: [facility] [place]   Imported By: Lester [place] 08/27/2009 09:51:30  _____________________________________________________________________  External Attachment:    Type:   Image     Comment:   External Document

## 2010-06-11 NOTE — Progress Notes (Signed)
Summary: weight   Phone Note Call from Patient Call back at [phone]   Summary of Call: Weight last night 247.6 this am 247.3 Initial call taken by: Migdalia Dk,  June 06, 2009 9:08 AM  Follow-up for Phone Call        dr Myrtis Ser ok , wt ok, pt will c/b w/wt on tue Meredith Staggers, RN  June 06, 2009 2:30 PM

## 2010-06-11 NOTE — Assessment & Plan Note (Signed)
Summary: 6      Allergies Added:   Visit Type:  Follow-up Referring Provider:  Zenovia Jordan, MD Primary Provider:  Jacques Navy MD  CC:  CAD.  History of Present Illness: The patient has a brief office visit with me this morning as he is headed to see Dr.Norins.  He is actually feeling extremely well.  His volume status is quite good.  I am aware from Dr.Norins that some recent labs suggest worsening of his renal function.  I will discuss this with Dr. Debby Bud today after he sees the patient.  [name] has not been having any chest pain.  I'm pleased with how stable he is in general.  See also my note of July 24 2009.  Current Medications (verified): 1)  Carvedilol 6.25 Mg Tabs (Carvedilol) .... Take One Tablet By Mouth Twice A Day 2)  Furosemide 40 Mg Tabs (Furosemide) .... Take 2 Tablets in Am 1 Tab in Pm 3)  Klor-Con M20 20 Meq  Tbcr (Potassium Chloride Crys Cr) .Marland Kitchen.. 1 By Mouth Two Times A Day 4)  Prilosec Otc 20 Mg Tbec (Omeprazole Magnesium) .... Take One Tablet By Mouth Once Daily. 5)  Aspirin Ec 325 Mg Tbec (Aspirin) .... Take One Tablet By Mouth Daily 6)  Bethanechol Chloride 25 Mg Tabs (Bethanechol Chloride) .... Three Times A Day 7)  Allopurinol 300 Mg Tabs (Allopurinol) .... Take One Tablet By Mouth Once Daily. 8)  Restasis 0.05 %  Emul (Cyclosporine) .... One Drop Each Eye Two Times A Day 9)  Ocuvite Preservision  Tabs (Multiple Vitamins-Minerals) .Marland Kitchen.. 1 Two Times A Day 10)  Flomax 0.4 Mg Xr24h-Cap (Tamsulosin Hcl) .... Take One Tablet By Mouth Once Daily. 11)  Nasal Moisturizing Spray 0.65 % Soln (Saline) .... Prn 12)  Alprazolam 0.25 Mg Tabs (Alprazolam) .... As Needed 13)  Plavix 75 Mg Tabs (Clopidogrel Bisulfate) .Marland Kitchen.. 1 By Mouth Daily 14)  Nitrostat 0.4 Mg Subl (Nitroglycerin) .... As Needed 15)  Acetaminophen 325 Mg  Tabs (Acetaminophen) .... As Needed 16)  Clonidine Hcl 0.1 Mg Tabs (Clonidine Hcl) .... One By Mouth Three Times A Day 17)  Isosorbide Mononitrate  Cr 60 Mg Xr24h-Tab (Isosorbide Mononitrate) .... One By Mouth Three Times A Day 18)  Hydralazine Hcl 100 Mg Tabs (Hydralazine Hcl) .... 3/4 Tablet Three Times A Day 19)  Gabapentin 300 Mg Caps (Gabapentin) .... Three Times A Day 20)  Metolazone 2.5 Mg Tabs (Metolazone) .... Take One Tablet By Mouth Daily As Directed 21)  Colcrys 0.6 Mg Tabs (Colchicine) .Marland Kitchen.. 1 Tab Three Times A Day As Needed 22)  Isosorbide Dinitrate 10 Mg Tabs (Isosorbide Dinitrate) .... Take One Capsule By Mouth Three Times A Day 23)  Multivitamins   Tabs (Multiple Vitamin) .... Once Daily  Allergies (verified): 1)  ! Cortisone 2)  ! Augmentin 3)  ! 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 severe native disease... LIMA to the LAD patent, SVG to OM patent but sequential grafts to the posterior lateral of the OM and then the posterior lateral of the right occluded, SVG to diagonal occluded SVG to the right coronary artery occluded.... this was unchanged since 2006...  /   hospitalization...1.2011.Marland Kitchen elevated troponin/CHF.Marland Kitchen no work-up ischemic cardiomyopathy ICD  placed  05/12/2009 EF 40%  echo..01/2006..inferior/posterior akinesis /  30% echo... June, 2010... technically limited study A/C systolic CHF.[facility]..diuresed Combative in hospital  05/2009...treated  haldol... Carotid aretery disease...doppler..04/2007.Marland Kitchen0-39% RICA.Marland Kitchen60-79%  LICA  /   Doppler... March 21, 2009... 0-39% R. ICA, 60-79% LICA... unchanged he is no rebound Abdominal bruit but no abdominal aortic aneurysm and no renal artery stenosis Grief over wife having severe Alzheimer's bradycardia mild not symptomatic Gout.Marland Kitchenallopurinol and p.r.n. colchicine Hypertension Nephrolithiasis, hx  of Prostatitis, hx of  Hyperlipidemia.Marland Kitchenintolerant to all cholesterol medications Cholelithiasis Chronic kidney disease.. Dr. 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 removed at [facility] in the past Aortic root dilitation...mild.. Mild musculoskeletal back pain Renal and liver cysts on CT scan Chronic mild dizziness Augmentin allergy with swelling of the tongue Renal insufficiency... function varies  volume overloaded.  June 03, 2009... hospital discharge... BUN 30 / creatinine 1.67           Cardiology - Myrtis Ser          GU - Darvin Neighbours          Derm - Dr. Mayford Knife in [place]          Vasc Surgeon - Madilyn Fireman          GI - Yancey Flemings         GS - [name]  Review of Systems       Patient denies fever, chills, headache, sweats, rash, change in vision, change in hearing, chest pain, cough, nausea vomiting, urinary symptoms.  All other systems are reviewed and are negative.  Vital Signs:  Patient profile:   70 year old male Height:      71 inches Weight:      236 pounds BMI:     33.03 Pulse rate:   60 / minute BP sitting:   106 / 52  (left arm) Cuff size:   large  Vitals Entered By: Hardin Negus, RMA (September 01, 2009 9:56 AM)  Physical Exam  General:  patient looks quite good today. Eyes:  no xanthelasma. Neck:  no jugular venous distention. Lungs:  lungs are clear.  Respiratory effort is nonlabored. Heart:  cardiac exam reveals S1 and S2.  No clicks or significant murmurs. Abdomen:  abdomen is soft. Extremities:  no peripheral edema. Psych:  patient is oriented to person time and place.  Affect is normal.    ICD Specifications Following MD:  Lewayne Bunting, MD     ICD Vendor:  [employer]     ICD Model Number:  [id number]     ICD Serial Number:  [id number] ICD DOI:  05/12/2009     ICD Implanting MD:  Lewayne Bunting, MD  Lead 1:    Location: RV     DOI: 05/12/2009     Model #: [id number]     Serial #: [id number]     Status:  active  ICD Follow Up ICD Dependent:  No      Episodes Coumadin:  No  Brady Parameters Mode VVI     Lower Rate Limit:  40      Tachy Zones VF:  240     VT:  200     VT1:  173     Impression & Recommendations:  Problem # 1:  CAD (ICD-414.00)  His updated medication list for this problem includes:    Carvedilol 6.25 Mg Tabs (Carvedilol) .Marland Kitchen... Take one tablet by mouth twice a day    Aspirin Ec 325 Mg Tbec (Aspirin) .Marland Kitchen... Take one  tablet by mouth daily    Plavix 75 Mg Tabs (Clopidogrel bisulfate) .Marland Kitchen... 1 by mouth daily    Nitrostat 0.4 Mg Subl (Nitroglycerin) .Marland Kitchen... As needed    Isosorbide Mononitrate Cr 60 Mg Xr24h-tab (Isosorbide mononitrate) ..... One by mouth three times a day    Isosorbide Dinitrate 10 Mg Tabs (Isosorbide dinitrate) .Marland Kitchen... Take one capsule by mouth three times a day Coronary disease is stable.  The patient will be seeing Dr.Norins later today for more complete evaluation of his overall status.  Patient Instructions: 1)  Follow up in 3 months

## 2010-06-11 NOTE — Assessment & Plan Note (Signed)
Summary: 3 month rov/sl  Medications Added FUROSEMIDE 40 MG TABS (FUROSEMIDE) take 1 tablet two times a day extra as needed ISOSORBIDE DINITRATE CR 40 MG CR-TABS (ISOSORBIDE DINITRATE) Take one capsule by mouth two times a day      Allergies Added:   Visit Type:  Follow-up Referring Provider:  Zenovia Jordan, MD Primary Provider:  Jacques Navy MD  CC:  CAD.  History of Present Illness: The patient is seen for followup of coronary disease and fluid management.  It continues to be quite difficult to keep his volume status and renal function stable.  When we diurese him adequately he develops worsening renal dysfunction.  I appreciate the help from Dr.Norins and others in the primary care team.  The patient fell recently.  Ultimately it was noted that his creatinine had gone up.  His diuretics were held for several days and furosemide was restarted yesterday 40 b.i.d. He's feeling well.  He thinks he may have had the beginning of slight shortness of breath yesterday but this resolved.  He's had some continued vertigo.  He does have meclizine at home but did not know that this would help with the vertigo.   I have reviewed the EMR records concerning other office visits with the primary care team.  Current Medications (verified): 1)  Carvedilol 6.25 Mg Tabs (Carvedilol) .... Take One Tablet By Mouth Twice A Day 2)  Furosemide 40 Mg Tabs (Furosemide) .... Take 1 Tablet Two Times A Day Extra As Needed 3)  Klor-Con M20 20 Meq  Tbcr (Potassium Chloride Crys Cr) .Marland Kitchen.. 1 By Mouth Two Times A Day 4)  Prilosec Otc 20 Mg Tbec (Omeprazole Magnesium) .... Take One Tablet By Mouth Once Daily. 5)  Aspirin Ec 325 Mg Tbec (Aspirin) .... Take One Tablet By Mouth Daily 6)  Allopurinol 300 Mg Tabs (Allopurinol) .... Take One Tablet By Mouth Once Daily. 7)  Restasis 0.05 %  Emul (Cyclosporine) .... One Drop Each Eye Two Times A Day 8)  Ocuvite Preservision  Tabs (Multiple Vitamins-Minerals) .Marland Kitchen.. 1 Two Times  A Day 9)  Flomax 0.4 Mg Xr24h-Cap (Tamsulosin Hcl) .... Take One Tablet By Mouth Once Daily. 10)  Nasal Moisturizing Spray 0.65 % Soln (Saline) .... Prn 11)  Alprazolam 0.25 Mg Tabs (Alprazolam) .... As Needed 12)  Plavix 75 Mg Tabs (Clopidogrel Bisulfate) .Marland Kitchen.. 1 By Mouth Daily 13)  Nitrostat 0.4 Mg Subl (Nitroglycerin) .... As Needed 14)  Acetaminophen 325 Mg  Tabs (Acetaminophen) .... As Needed 15)  Clonidine Hcl 0.1 Mg Tabs (Clonidine Hcl) .... One By Mouth Three Times A Day 16)  Isosorbide Mononitrate Cr 60 Mg Xr24h-Tab (Isosorbide Mononitrate) .... One By Mouth Three Times A Day 17)  Hydralazine Hcl 100 Mg Tabs (Hydralazine Hcl) .... 3/4 Tablet Three Times A Day 18)  Gabapentin 300 Mg Caps (Gabapentin) .... Twice  Times A Day 19)  Metolazone 2.5 Mg Tabs (Metolazone) .... Take One Tablet By Mouth Daily As Directed 20)  Colcrys 0.6 Mg Tabs (Colchicine) .Marland Kitchen.. 1 Tab Three Times A Day As Needed 21)  Multivitamins   Tabs (Multiple Vitamin) .... Once Daily 22)  Mucinex D 60-600 Mg Xr12h-Tab (Pseudoephedrine-Guaifenesin) .... Daily As Needed Fofr Thick, Sticky Mucus 23)  Antivert 25 Mg Tabs (Meclizine Hcl) .Marland Kitchen.. 1 By Mouth Every 4 Hours As Needed For Dizzy Symptoms 24)  Fluticasone Propionate 50 Mcg/act Susp (Fluticasone Propionate) .... 2 Spray/side Once Daily 25)  Tramadol Hcl 50 Mg Tabs (Tramadol Hcl) .Marland Kitchen.. 1-2 By Mouth Q 6  Hrs As Needed  Allergies (verified): 1)  ! Cortisone 2)  ! Augmentin 3)  ! Nsaids  Past History:  Past Medical History: UCD Polio -70 y/o  CABG 4 Coronary artery disease..catheterization 2006 new occlusion vein graft  / myoview..01/2006..inferior scar ..mild peri-infarct ischemia  /  non-STEMI April 03, 2009...Marland KitchenMarland Kitchen catheterization April 07, 2009... severe native disease... LIMA to the LAD patent, SVG to OM patent but sequential grafts to the posterior lateral of the OM and then the posterior lateral of the right occluded, SVG to diagonal occluded SVG to the right  coronary artery occluded.... this was unchanged since 2006...  /   hospitalization...1.2011.Marland Kitchen elevated troponin/CHF.Marland Kitchen no work-up ischemic cardiomyopathy ICD  placed  05/12/2009 EF 40%  echo..01/2006..inferior/posterior akinesis /  30% echo... June, 2010... technically limited study A/C systolic CHF.[facility] 05/[facility]2009..diuresed Combative in hospital  05/2009...treated  haldol... Carotid aretery disease...doppler..04/2007.Marland Kitchen0-39% RICA.Marland Kitchen60-79%  LICA  /   Doppler... March 21, 2009... 0-39% R. ICA, 60-79% LICA... unchanged he is no rebound Abdominal bruit but no abdominal aortic aneurysm and no renal artery stenosis Grief over wife having severe Alzheimer's bradycardia mild not symptomatic Gout.Marland Kitchenallopurinol and p.r.n. colchicine Hypertension Nephrolithiasis, hx of Prostatitis, hx of  Hyperlipidemia.Marland Kitchenintolerant to all cholesterol medications Cholelithiasis Chronic kidney disease.. Dr. 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 removed at [facility] in the past Aortic root dilitation...mild.. Mild musculoskeletal back pain Renal and liver cysts on CT scan Chronic mild dizziness Augmentin allergy with swelling of the tongue Renal insufficiency... function varies  volume overloaded.  June 03, 2009... hospital discharge... BUN 30 / creatinine 1.67           Cardiology - Myrtis Ser          GU - Darvin Neighbours          Derm - Dr. Mayford Knife in [place]          Vasc Surgeon - Madilyn Fireman          GI - Yancey Flemings         GS - Gross  Review of Systems       Patient denies fever, chills, headache, sweats, rash, change in vision, change in hearing, chest pain, cough, nausea vomiting, urinary symptoms.  He notes some slight redness in the left lower extremity.  All of the systems are reviewed and are negative.  Vital Signs:  Patient profile:   70 year old male Height:      71 inches Weight:      246 pounds BMI:     34.43 Pulse rate:   60 /  minute BP sitting:   148 / 84  (left arm) Cuff size:   large  Vitals Entered By: Hardin Negus, RMA (November 27, 2009 9:31 AM)  Physical Exam  General:  Patient is stable today. Head:  head is atraumatic. Eyes:  no xanthelasma. Neck:  no jugular venous stent. Chest Wall:  no chest wall tenderness. Lungs:  lungs are clear.  Respiratory effort is nonlabored. Heart:  cardiac exam reveals S1 and S2.  No clicks or significant murmurs. Abdomen:  abdomen is soft. Msk:  no musculoskeletal deformities.  He is walking with a walker. Extremities:  trace peripheral edema. Skin:  there is slight redness to the medial aspect of the left lower extremity.  This is not hot.  I believe this is mild variation from his venous disease. Psych:  patient is oriented to person time and place.  Affect is normal.  ICD Specifications Following MD:  Lewayne Bunting, MD     ICD Vendor:  [facility] [employer]     ICD Model Number:  [id number]     ICD Serial Number:  [id number] ICD DOI:  05/12/2009     ICD Implanting MD:  Lewayne Bunting, MD  Lead 1:    Location: RV     DOI: 05/12/2009     Model #: 4782     Serial #: [id number]     Status: active  ICD Follow Up ICD Dependent:  No      Episodes Coumadin:  No  Brady Parameters Mode VVI     Lower Rate Limit:  40      Tachy Zones VF:  240     VT:  200     VT1:  173     Impression & Recommendations:  Problem # 1:  * SLIGHT REDNESS LEFT LOWER EXTREMITY this area of slight redness does not appear to be phlebitis.  No further therapy.  Problem # 2:  DIZZINESS (ICD-780.4) The patient does have some vertigo.  We will instruct him on how to use his meclizine.  Problem # 3:  RENAL INSUFFICIENCY (ICD-588.9)  Labs will be checked today to reassess his renal function.  We may have to leave his creatinine on the higher side to keep his volume status stable.  Orders: TLB-BMP (Basic Metabolic Panel-BMET) (80048-METABOL)  Problem # 4:  EDEMA (ICD-782.3)  There is mild edema today.  His  diuretics have been held but is restarted yesterday.  Orders: TLB-BMP (Basic Metabolic Panel-BMET) (80048-METABOL)  Problem # 5:  CAD (ICD-414.00) Coronary disease is stable.  We will reinstruct him on the use of his nitrate.  Problem # 6:  CHRONIC SYSTOLIC HEART FAILURE (ICD-428.22) The patient's volume status can be very tenuous.  If he becomes mildly overloaded he has difficulties in enzymes in the emergency room.  His Lasix was restarted yesterday.  He is not using any Zaroxolyn.  He will continue on Lasix 40 b.i.d. today.  Renal function will be checked today.  We will call and speak to him frequently about his volume status.  If his fluid status increases we will push his diuretics higher.  Of course there will be careful attention to any signs of orthostatic hypotension  Patient Instructions: 1)  Lab today 2)  Start taking your Antivert two times a day and see if it helps with your dizziness 3)  Make sure you are taking Isosorbide Dinitrate two times a day  4)  Follow up Thursday Aug 11 at 9:15am

## 2010-06-11 NOTE — Miscellaneous (Signed)
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 severe native disease... LIMA to the LAD patent, SVG to OM patent but sequential grafts to the posterior lateral of the OM and then the posterior lateral of the right occluded, SVG to diagonal occluded SVG to the right coronary artery occluded.... this was unchanged since 2006... ischemic cardiomyopathy ICD  placed  05/12/2009 EF 40%  echo..01/2006..inferior/posterior akinesis /  30% echo... June, 2010... technically limited study A/C systolic CHF.[facility] 05/31/2009..diuresed Combative behavior  in hospital  05/2009...treated twith haldol...improved Carotid aretery disease...doppler..04/2007.Marland Kitchen0-39% RICA.Marland Kitchen60-79%  LICA  /   Doppler... March 21, 2009... 0-39% R. ICA, 60-79% LICA... unchanged he is no rebound Abdominal bruit but no abdominal aortic aneurysm and no renal artery stenosis Grief over wife having severe Alzheimer's bradycardia mild not symptomatic Gout.Marland Kitchenallopurinol and p.r.n. colchicine Hypertension Nephrolithiasis, hx of Prostatitis, hx of  Hyperlipidemia.Marland Kitchenintolerant to all cholesterol medications Cholelithiasis Chronic kidney disease.. Dr. 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 removed at [facility] in the past Aortic root dilitation...mild.. Mild musculoskeletal back pain Renal and liver cysts on CT scan Chronic mild dizziness Augmentin allergy with swelling of the tongue physician roster:          Cardiology - Myrtis Ser          GU - Ron Earlene Plater          Derm - Dr. 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 severe native disease... LIMA to the LAD patent, SVG to OM patent but sequential grafts to the posterior lateral of the OM and then the posterior lateral of the right occluded, SVG to diagonal occluded SVG to the right coronary artery occluded.... this was unchanged since 2006... ischemic cardiomyopathy ICD  placed  05/12/2009 EF 40%  echo..01/2006..inferior/posterior akinesis /  30% echo... June, 2010... technically limited study A/C systolic CHF.[facility] 05/31/2009..diuresed Combative behavior  in hospital  05/2009...treated twith haldol...improved Carotid aretery disease...doppler..04/2007.Marland Kitchen0-39% RICA.Marland Kitchen60-79%  LICA  /   Doppler... March 21, 2009... 0-39% R. ICA, 60-79% LICA... unchanged he is no rebound Abdominal bruit but no abdominal aortic aneurysm and no renal artery stenosis Grief over wife having severe Alzheimer's bradycardia mild not symptomatic Gout.Marland Kitchenallopurinol and p.r.n. colchicine Hypertension Nephrolithiasis, hx of Prostatitis, hx of  Hyperlipidemia.Marland Kitchenintolerant to all cholesterol medications Cholelithiasis Chronic kidney disease.. Dr. 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 removed at [facility] in the past Aortic root dilitation...mild.. Mild musculoskeletal back pain Renal and liver cysts on CT scan Chronic mild dizziness Augmentin allergy with  swelling of the tongue physician roster:           Cardiology - Myrtis Ser          GU - Darvin Neighbours          Derm - Dr. Mayford Knife in [place]          Vasc Surgeon - Madilyn Fireman          GI - Yancey Flemings         GS - Gross

## 2010-06-11 NOTE — Assessment & Plan Note (Signed)
Summary: 1wk f/u sl      Allergies Added:   Visit Type:  Follow-up Referring Provider:  Zenovia Jordan, MD Primary Provider:  Jacques Navy MD  CC:  congestive heart failure.[name]  History of Present Illness: The patient is here today for a complete mobility exam to help in his request for a powered  mobility device.  The patient is also  here to followup his volume overload.  He has changed his diet and this is helping significantly.  His weight has remained stable other than a mild increase during the week.  We were in touch with him and his Lasix dose was adjusted for one day.  He did take one dose of Zaroxolyn after I saw him last.  He had marked diuresis with this single dose.  Is not having any chest pain.  The patient's mobility is significantly reduced.  See physical exam.  See also the notes at the end concerning his overall medical ability as it relates to ambulation.  Current Medications (verified): 1)  Carvedilol 6.25 Mg Tabs (Carvedilol) .... Take One Tablet By Mouth Twice A Day 2)  Furosemide 40 Mg Tabs (Furosemide) .... Take 2 Tablets By Mouth Two Times A Day 3)  Klor-Con M20 20 Meq  Tbcr (Potassium Chloride Crys Cr) .[name].. 1 By Mouth Two Times A Day 4)  Prilosec Otc 20 Mg Tbec (Omeprazole Magnesium) .... Take One Tablet By Mouth Once Daily. 5)  Aspirin 81 Mg  Tabs (Aspirin) .[name].. 1 By Mouth Daily 6)  Bethanechol Chloride 25 Mg Tabs (Bethanechol Chloride) .... Three Times A Day 7)  Allopurinol 300 Mg Tabs (Allopurinol) .... Take One Tablet By Mouth Once Daily. 8)  Restasis 0.05 %  Emul (Cyclosporine) .... One Drop Each Eye Two Times A Day 9)  Ocuvite Preservision  Tabs (Multiple Vitamins-Minerals) .[name].. 1 Two Times A Day 10)  Flomax 0.4 Mg Xr24h-Cap (Tamsulosin Hcl) .... Take One Tablet By Mouth Once Daily. 11)  Nasal Moisturizing Spray 0.65 % Soln (Saline) .... Prn 12)  Alprazolam 0.25 Mg Tabs (Alprazolam) .... As Needed 13)  Plavix 75 Mg Tabs (Clopidogrel Bisulfate) .[name].. 1  By Mouth Daily 14)  Nitrostat 0.4 Mg Subl (Nitroglycerin) .... As Needed 15)  Acetaminophen 325 Mg  Tabs (Acetaminophen) .... As Needed 16)  Clonidine Hcl 0.1 Mg Tabs (Clonidine Hcl) .... One By Mouth Three Times A Day 17)  Isosorbide Mononitrate Cr 60 Mg Xr24h-Tab (Isosorbide Mononitrate) .... One By Mouth Three Times A Day 18)  Hydralazine Hcl 100 Mg Tabs (Hydralazine Hcl) .... 3/4 Tablet Three Times A Day 19)  Gabapentin 300 Mg Caps (Gabapentin) .... Three Times A Day 20)  Metolazone 2.5 Mg Tabs (Metolazone) .... Take One Tablet By Mouth Daily As Directed  Allergies (verified): 1)  ! Cortisone 2)  ! Augmentin 3)  ! Nsaids  Past History:  Past Medical History: Last updated: 06/16/2009 UCD Polio -70 y/o CABG 31 Coronary artery disease..catheterization 2006 new occlusion vein graft  / myoview..01/2006..inferior scar ..mild peri-infarct ischemia  /  non-STEMI April 03, 2009...[name] KitchenMarland [name] catheterization April 07, 2009... severe native disease... LIMA to the LAD patent, SVG to OM patent but sequential grafts to the posterior lateral of the OM and then the posterior lateral of the right occluded, SVG to diagonal occluded SVG to the right coronary artery occluded.... this was unchanged since 2006...  /   hospitalization...1.2011.[name] elevated troponin/CHF.[name] no work-up ischemic cardiomyopathy ICD  placed  05/12/2009 EF 40%  echo..01/2006..inferior/posterior akinesis /  30% echo.[name] KitchenMarland [name]  June, 2010... technically limited study A/C systolic [name]..diuresed Combative in hospital  05/2009...treated  haldol... Carotid aretery disease...doppler..04/2007.[name] Kitchen0-39% RICA.[name] Kitchen60-79%  LICA  /   Doppler... March 21, 2009... 0-39% R. ICA, 60-79% LICA... unchanged he is no rebound Abdominal bruit but no abdominal aortic aneurysm and no renal artery stenosis Grief over wife having severe Alzheimer's bradycardia mild not symptomatic Gout.[name] Kitchenallopurinol and p.r.n. colchicine Hypertension Nephrolithiasis, hx  of Prostatitis, hx of  Hyperlipidemia.[name] Kitchenintolerant to all cholesterol medications Cholelithiasis Chronic kidney disease.. Dr. Elvis Coil History brain tumor removed successfully in the past  Niaspan intolerance melanoma...[name] Kitchenon his back... removed at [facility] in the past Aortic root dilitation...mild.. Mild musculoskeletal back pain Renal and liver cysts on CT scan Chronic mild dizziness Augmentin allergy with swelling of the tongue Renal insufficiency... function varies  volume overloaded.  June 03, 2009... hospital discharge... BUN 30 / creatinine 1.67           Cardiology - Myrtis Ser          GU - Ron Earlene Plater          Derm - Dr. Mayford Knife in [place]          Vasc Surgeon - Madilyn Fireman          GI - Yancey Flemings         GS - Gross  Past Surgical History: Last updated: 05/29/2007 arthoroscopy-right knee nephrolithiasis surgery - August '87 brain tumor - excision August '99 melanoma excision from back - June '82 Dr. Pixie Casino at [facility] Coronary artery bypass graft- LIMA to LAD; SVG to dx, OM, Cx, RCA  '93 Lap Chole  October '08  Review of Systems       The patient denies fever, chills, headache, sweats, rash, change in vision, change in hearing, chest pain, cough, shortness of breath.  He has no nausea vomiting or urinary symptoms.  His musculoskeletal mobility is limited.  All of the systems are reviewed and are negative.  Vital Signs:  Patient profile:   70 year old male Height:      71 inches Weight:      248 pounds BMI:     34.71 O2 Sat:      97 % on Room air Pulse rate:   65 / minute BP sitting:   132 / 68  (right arm) Cuff size:   regular  Vitals Entered By: Hardin Negus, RMA (June 27, 2009 9:21 AM)  O2 Flow:  Room air  Physical Exam  General:  in general the patient is somewhat better today.  He is not having any significant shortness of breath at rest.  He is here with his walker.  He is limited and needs his walker. Head:  head is atraumatic. Eyes:  no xanthelasma. Neck:   no jugular venous distention. Chest Wall:  no chest wall tenderness. Lungs:  lungs are clear.  Respiratory effort is nonlabored. Heart:  cardiac exam reveals an S1-S2.  No clicks or significant murmurs. Abdomen:  abdomen is soft. Msk:  There is mild decrease range of motion of his left lower extremity.  The patient has 4/5 muscle strength in his right upper extremity and left upper extremity.  There is 4/5 muscle strength in his right lower extremity and 3/5 strength in his left lower extremity.  With walking he has mild imbalance.  He needs his walker to help stabilize. Extremities:  catheters no peripheral edema. Neurologic:  The patient is oriented to person time and place.  Affect is normal.  Cranial nerves are  normal.  Reflexes reveal mild decrease in reflex at his left knee.  He has no major sensory difficulties.  Muscle strength as described above. Skin:  There no skin rashes. Psych:  Patient is oriented to person time and place.  Affect is normal.    ICD Specifications Following MD:  Lewayne Bunting, MD     ICD Vendor:  [employer]     ICD Model Number:  [phone]     ICD Serial Number:  [id number] ICD DOI:  05/12/2009     ICD Implanting MD:  Lewayne Bunting, MD  Lead 1:    Location: RV     DOI: 05/12/2009     Model #: [id number]     Serial #: [id number]     Status: active  ICD Follow Up ICD Dependent:  No      Episodes Coumadin:  No  Brady Parameters Mode VVI     Lower Rate Limit:  40      Tachy Zones VF:  240     VT:  200     VT1:  173     Impression & Recommendations:  Problem # 1:  RENAL INSUFFICIENCY (ICD-588.9) The patient's chemistries were checked when he was here last week.  Creatinine was 1.8 BUN was 33.  This is in a reasonable range for him.  Potassium was normal.  He is not need labs today.  Problem # 2:  EDEMA (ICD-782.3) Edema is stable today.  We will not change his diuretics at this point.  Problem # 3:  CAD (ICD-414.00)  His updated medication list for this problem includes:     Carvedilol 6.25 Mg Tabs (Carvedilol) .[name]... Take one tablet by mouth twice a day    Plavix 75 Mg Tabs (Clopidogrel bisulfate) .[name]... 1 by mouth daily    Nitrostat 0.4 Mg Subl (Nitroglycerin) .[name]... As needed    Isosorbide Mononitrate Cr 60 Mg Xr24h-tab (Isosorbide mononitrate) ..... One by mouth three times a day Coronary disease is stable.  No change in therapy and this time.  Problem # 4:  HYPOKALEMIA (ICD-276.8) Patient is receiving potassium.  This is stable.  Problem # 5:  * ICD The patient's ICD site is stable.  Problem # 6:  HYPERTENSION (ICD-401.9)  His updated medication list for this problem includes:    Carvedilol 6.25 Mg Tabs (Carvedilol) .[name]... Take one tablet by mouth twice a day    Furosemide 40 Mg Tabs (Furosemide) .[name]... Take 2 tablets by mouth two times a day    Clonidine Hcl 0.1 Mg Tabs (Clonidine hcl) ..... One by mouth three times a day    Hydralazine Hcl 100 Mg Tabs (Hydralazine hcl) .[name]... 3/4 tablet three times a day    Metolazone 2.5 Mg Tabs (Metolazone) .[name]... Take one tablet by mouth daily as directed Blood pressure is controlled today.  No change in therapy.  Problem # 7:  * DECREASED MOBILITY The patient has limitations in mobility related to weakness in his left leg.  This affects his ability to move from room to room at times.  The patient does use a walker.  However this is not adequate for him at times because of his left leg weakness.  He also has an unsteady gait at times.  The patient has congestive heart failure.  This limits his ability to use a wheelchair for more prolonged activity.  He will have chest discomfort and he may have shortness of breath.  A scooter would not be helpful because of  his inability to use the blade tiller.  A powered mobility device would be an excellent choice for the patient.  He would be safe for him and very helpful.  Patient Instructions: 1)  Follow up in 4 weeks

## 2010-06-11 NOTE — Letter (Signed)
Summary: Device-Delinquent Phone Journalist, newspaper, Main Office  [address]   [place], [place] [address]   Phone: [phone]  Fax: [phone]     November 14, 2009 MRN: [id number]   PATRICIA PERALES [address] [place], [place]  [address]   Dear Mr. Losh,  According to our records, you were scheduled for a device phone transmission on                              .     We did not receive any results from this check.  If you transmitted on your scheduled day, please call us to help troubleshoot your system.  If you forgot to send your transmission, please send one upon receipt of this letter.  Thank you,   Architectural technologist [facility]

## 2010-06-11 NOTE — Progress Notes (Signed)
Summary: status of paperwork   Phone Note From Other Clinic   Caller: angel from [facility] [phone]  Request: Talk with Nurse Details for Reason: status of paper work pt need physical therapy eval -  Initial call taken by: Lorne Skeens,  July 03, 2009 1:07 PM  Follow-up for Phone Call        paperwork for [facility] was faxed earlier today spoke w/representative need to fax them an order for PT eval, it can be stamped, will fax order to [phone] Meredith Staggers, RN  July 03, 2009 2:36 PM

## 2010-06-11 NOTE — Progress Notes (Signed)
Summary: F/U on symptoms   ---- Converted from flag ---- ---- 11/27/2009 6:07 PM, Meredith Staggers, [name] wrote: [name] this is Dr Myrtis Ser heart failure pt who has been having trouble w/vertigo recently and Dr Debby Bud had held his lasix b/c he was orthostatic it was just restarted on 7/20 and we told him to start taking his antivert as well to see if this helps w/vertigo, can you please call him next week to see how he is doing, make sure his wt is stable no edema or sob and see if the med is helping vertigo, thanks, he is aware I'm gone and that you'll be calling him ------------------------------  Phone Note Outgoing Call   Call placed by: [name] Rad, [name], [name],  December 04, 2009 9:51 AM Call placed to: Patient Summary of Call: I called and spoke with the pt. He states he is doing "exceptionally well." He is holding steady on his weight, within about a 1 to 1 1/2 pounds of his baseline. His weight today was 141.6 lbs. He is taking his antivert. I will relay this information to Dr. Myrtis Ser. I have instructed the pt. to call back should he have any changes. He is agreeable. Initial call taken by: [name] Rad, [name], [name],  December 04, 2009 9:53 AM  Follow-up for Phone Call        Sounds good. Heather, please call him for another update. Talitha Givens, MD, [facility]  December 08, 2009 10:31 AM  spoke w/pt he states wt is stable but his dizziness has returned, he states he is out of his diazepam that he was given in the ER and needs a refill he believes that was helping w/his dizziness advised pt he needed to call Dr Debby Bud for a refill on that med he is agreeable Meredith Staggers, [name]  December 11, 2009 12:34 PM      Appended Document: F/U on symptoms Herbert Seta,  talk to me about this 12/15/2009.

## 2010-06-11 NOTE — Progress Notes (Signed)
Summary: pt dizzy x several days   Phone Note Call from Patient   Caller: Patient [phone] Reason for Call: Talk to Nurse Summary of Call: pt has been dizzy for several days-was seen in er last tuesday and was treated w/ med for vertigo-still dizzy-med not working-would like to talk w/nurse-pls call [phone] Initial call taken by: Glynda Jaeger,  November 03, 2009 10:26 AM  Follow-up for Phone Call        pt is still dizzy, he cant walk straight feels like he falls to the left, he went to ER Sun and Tue, BP ok, dizziness is off/on, no headache, has fallen twice, had CT of head at ER that was ok, he states his legs hurt like when he has a gout flare  up, advised pt to see Dr Debby Bud and to take his colchicine if needed for gout, he is agreeable Meredith Staggers, RN  November 03, 2009 3:30 PM

## 2010-06-11 NOTE — Assessment & Plan Note (Signed)
Summary: per flag/c d   Vital Signs:  Patient profile:   70 year old male Height:      71 inches Weight:      235 pounds BMI:     32.89 O2 Sat:      95 % on Room air Temp:     98.3 degrees F oral Pulse rate:   51 / minute BP sitting:   128 / 80  (left arm) Cuff size:   large  Vitals Entered By: Bill Salinas CMA (September 01, 2009 11:07 AM)  O2 Flow:  Room air   Primary Care Provider:  Jacques Navy MD   History of Present Illness: patient presents for evaluation of progressive renal insufficiency: creatinine went from baseline of 1.7-8 to 2.7 April 14th. By his admission he has limited his fluid intake to 10 oz ( 250 cc) per day. In addition he has been taking full dose of lasix. He is very concerned about recurrent CHF.  Patient reports he is having symptoms of prostititis with decreased force of steam, some back and lower abdominal pain, dysuria.   Current Medications (verified): 1)  Carvedilol 6.25 Mg Tabs (Carvedilol) .... Take One Tablet By Mouth Twice A Day 2)  Furosemide 40 Mg Tabs (Furosemide) .... Take 2 Tablets in Am 1 Tab in Pm 3)  Klor-Con M20 20 Meq  Tbcr (Potassium Chloride Crys Cr) .Marland Kitchen.. 1 By Mouth Two Times A Day 4)  Prilosec Otc 20 Mg Tbec (Omeprazole Magnesium) .... Take One Tablet By Mouth Once Daily. 5)  Aspirin Ec 325 Mg Tbec (Aspirin) .... Take One Tablet By Mouth Daily 6)  Bethanechol Chloride 25 Mg Tabs (Bethanechol Chloride) .... Three Times A Day 7)  Allopurinol 300 Mg Tabs (Allopurinol) .... Take One Tablet By Mouth Once Daily. 8)  Restasis 0.05 %  Emul (Cyclosporine) .... One Drop Each Eye Two Times A Day 9)  Ocuvite Preservision  Tabs (Multiple Vitamins-Minerals) .Marland Kitchen.. 1 Two Times A Day 10)  Flomax 0.4 Mg Xr24h-Cap (Tamsulosin Hcl) .... Take One Tablet By Mouth Once Daily. 11)  Nasal Moisturizing Spray 0.65 % Soln (Saline) .... Prn 12)  Alprazolam 0.25 Mg Tabs (Alprazolam) .... As Needed 13)  Plavix 75 Mg Tabs (Clopidogrel Bisulfate) .Marland Kitchen.. 1 By Mouth  Daily 14)  Nitrostat 0.4 Mg Subl (Nitroglycerin) .... As Needed 15)  Acetaminophen 325 Mg  Tabs (Acetaminophen) .... As Needed 16)  Clonidine Hcl 0.1 Mg Tabs (Clonidine Hcl) .... One By Mouth Three Times A Day 17)  Isosorbide Mononitrate Cr 60 Mg Xr24h-Tab (Isosorbide Mononitrate) .... One By Mouth Three Times A Day 18)  Hydralazine Hcl 100 Mg Tabs (Hydralazine Hcl) .... 3/4 Tablet Three Times A Day 19)  Gabapentin 300 Mg Caps (Gabapentin) .... Three Times A Day 20)  Metolazone 2.5 Mg Tabs (Metolazone) .... Take One Tablet By Mouth Daily As Directed 21)  Colcrys 0.6 Mg Tabs (Colchicine) .Marland Kitchen.. 1 Tab Three Times A Day As Needed 22)  Isosorbide Dinitrate 10 Mg Tabs (Isosorbide Dinitrate) .... Take One Capsule By Mouth Three Times A Day 23)  Multivitamins   Tabs (Multiple Vitamin) .... Once Daily  Allergies (verified): 1)  ! Cortisone 2)  ! Augmentin 3)  ! Nsaids  Past History:  Past Medical History: Last updated: 06/16/2009 UCD Polio -70 y/o CABG 86 Coronary artery disease..catheterization 2006 new occlusion vein graft  / myoview..01/2006..inferior scar ..mild peri-infarct ischemia  /  non-STEMI April 03, 2009...Marland KitchenMarland Kitchen catheterization April 07, 2009... severe native disease... LIMA to the LAD  patent, SVG to OM patent but sequential grafts to the posterior lateral of the OM and then the posterior lateral of the right occluded, SVG to diagonal occluded SVG to the right coronary artery occluded.... this was unchanged since 2006...  /   hospitalization...1.2011.Marland Kitchen elevated troponin/CHF.Marland Kitchen no work-up ischemic cardiomyopathy ICD  placed  05/12/2009 EF 40%  echo..01/2006..inferior/posterior akinesis /  30% echo... June, 2010... technically limited study A/C systolic [facility]..diuresed Combative in hospital  05/2009...treated  haldol... Carotid aretery disease...doppler..04/2007.Marland Kitchen0-39% RICA.Marland Kitchen60-79%  LICA  /   Doppler... March 21, 2009... 0-39% R. ICA, 60-79% LICA... unchanged he is no  rebound Abdominal bruit but no abdominal aortic aneurysm and no renal artery stenosis Grief over wife having severe Alzheimer's bradycardia mild not symptomatic Gout.Marland Kitchenallopurinol and p.r.n. colchicine Hypertension Nephrolithiasis, hx of Prostatitis, hx of  Hyperlipidemia.Marland Kitchenintolerant to all cholesterol medications Cholelithiasis Chronic kidney disease.. Dr. 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 removed at [facility] in the past Aortic root dilitation...mild.. Mild musculoskeletal back pain Renal and liver cysts on CT scan Chronic mild dizziness Augmentin allergy with swelling of the tongue Renal insufficiency... function varies  volume overloaded.  June 03, 2009... hospital discharge... BUN 30 / creatinine 1.67           Cardiology - Myrtis Ser          GU - Ron Earlene Plater          Derm - Dr. Mayford Knife in [place]          Vasc Surgeon - Madilyn Fireman          GI - Yancey Flemings         GS - Gross  Past Surgical History: Last updated: 06-18-07 arthoroscopy-right knee nephrolithiasis surgery - August '87 brain tumor - excision August '99 melanoma excision from back - June '82 Dr. Pixie Casino at [facility] Coronary artery bypass graft- LIMA to LAD; SVG to dx, OM, Cx, RCA  '93 Lap Chole  October '08  Family History: Last updated: 06/18/2007 father - deceased, accidental death mother- CAD/MI Neg - colon, prostate cancer; DM  Social History: Last updated: 2007-06-18 HSG, some college work Married '65 1 son retired - wharehouse clerk [employer]  Review of Systems       easy bruising.   Physical Exam  General:  Elderly white male in no distress. Head:  Normocephalic and atraumatic without obvious abnormalities. No apparent alopecia or balding. Eyes:  pupils equal, pupils round, and corneas and lenses clear.   Neck:  supple and full ROM.   Chest Wall:  no deformities.   Lungs:  normal respiratory effort, normal  breath sounds, no crackles, and no wheezes.   Heart:  normal rate and regular rhythm.   Msk:  No deformity or scoliosis noted of thoracic or lumbar spine.   Pulses:  2+ radial Neurologic:  alert & oriented X3, cranial nerves II-XII intact, strength normal in all extremities, gait normal, and DTRs symmetrical and normal.   Skin:  turgor normal, color normal, and no suspicious lesions.  Sub Q hemorrahges. Facial rash-erythematous macular rash at bridge of nose and infra-orbital area. Psych:  Oriented X3, memory intact for recent and remote, and normally interactive.     Impression & Recommendations:  Problem # 1:  RENAL INSUFFICIENCY (ICD-588.9) Progressive renal insufficiency with a rise in Cr from 1.8 to 2.7. Patient may be prerenal given his very strict fluid restriction - 250cc per day or less.   Plan -  liberalize fluid intake to 800-1000cc daily          f/u Bmet in 1 week  Problem # 2:  GOUT (ICD-274.9) Good control iwth uric acid of 5.9  His updated medication list for this problem includes:    Allopurinol 300 Mg Tabs (Allopurinol) .Marland Kitchen... Take one tablet by mouth once daily.    Colcrys 0.6 Mg Tabs (Colchicine) .Marland Kitchen... 1 tab three times a day as needed  Problem # 3:  PROSTATITIS, ACUTE (ICD-601.0)  Based on patient's symptoms -  Plan - Cipro 250mg  two times a day x 10 days.   Orders: Prescription Created Electronically [phone])  Complete Medication List: 1)  Carvedilol 6.25 Mg Tabs (Carvedilol) .... Take one tablet by mouth twice a day 2)  Furosemide 40 Mg Tabs (Furosemide) .... Take 2 tablets in am 1 tab in pm 3)  Klor-con M20 20 Meq Tbcr (Potassium chloride crys cr) .Marland Kitchen.. 1 by mouth two times a day 4)  Prilosec Otc 20 Mg Tbec (Omeprazole magnesium) .... Take one tablet by mouth once daily. 5)  Aspirin Ec 325 Mg Tbec (Aspirin) .... Take one tablet by mouth daily 6)  Bethanechol Chloride 25 Mg Tabs (Bethanechol chloride) .... Three times a day 7)  Allopurinol 300 Mg Tabs  (Allopurinol) .... Take one tablet by mouth once daily. 8)  Restasis 0.05 % Emul (Cyclosporine) .... One drop each eye two times a day 9)  Ocuvite Preservision Tabs (Multiple vitamins-minerals) .Marland Kitchen.. 1 two times a day 10)  Flomax 0.4 Mg Xr24h-cap (Tamsulosin hcl) .... Take one tablet by mouth once daily. 11)  Nasal Moisturizing Spray 0.65 % Soln (Saline) .... Prn 12)  Alprazolam 0.25 Mg Tabs (Alprazolam) .... As needed 13)  Plavix 75 Mg Tabs (Clopidogrel bisulfate) .Marland Kitchen.. 1 by mouth daily 14)  Nitrostat 0.4 Mg Subl (Nitroglycerin) .... As needed 15)  Acetaminophen 325 Mg Tabs (Acetaminophen) .... As needed 16)  Clonidine Hcl 0.1 Mg Tabs (Clonidine hcl) .... One by mouth three times a day 17)  Isosorbide Mononitrate Cr 60 Mg Xr24h-tab (Isosorbide mononitrate) .... One by mouth three times a day 18)  Hydralazine Hcl 100 Mg Tabs (Hydralazine hcl) .... 3/4 tablet three times a day 19)  Gabapentin 300 Mg Caps (Gabapentin) .... Three times a day 20)  Metolazone 2.5 Mg Tabs (Metolazone) .... Take one tablet by mouth daily as directed 21)  Colcrys 0.6 Mg Tabs (Colchicine) .Marland Kitchen.. 1 tab three times a day as needed 22)  Isosorbide Dinitrate 10 Mg Tabs (Isosorbide dinitrate) .... Take one capsule by mouth three times a day 23)  Multivitamins Tabs (Multiple vitamin) .... Once daily 24)  Ciprofloxacin Hcl 250 Mg Tabs (Ciprofloxacin hcl) .Marland Kitchen.. 1 by mouth two times a day x 10 days for prostatitis Prescriptions: CIPROFLOXACIN HCL 250 MG TABS (CIPROFLOXACIN HCL) 1 by mouth two times a day x 10 days for prostatitis  #20 x 0   Entered and Authorized by:   Jacques Navy MD   Signed by:   Jacques Navy MD on 09/01/2009   Method used:   Electronically to        [facility]  [id number] (retail)       [address]       [place], [place]  [address]       Ph: [phone]       Fax: [phone]   RxID:   [id number]

## 2010-06-11 NOTE — Procedures (Signed)
Summary: [facility]    Allergies: 1)  ! Cortisone 2)  ! Augmentin 3)  ! Nsaids   ICD Specifications Following MD:  Lewayne Bunting, MD     ICD Vendor:  St Jude     ICD Model Number:  [phone]     ICD Serial Number:  [id number] ICD DOI:  05/12/2009     ICD Implanting MD:  Lewayne Bunting, MD  Lead 1:    Location: RV     DOI: 05/12/2009     Model #: [id number]     Serial #: [id number]     Status: active  ICD Follow Up Battery Voltage:  92% V     Charge Time:  8.8 seconds     Battery Est. Longevity:  9.1-9.5 yrs Underlying rhythm:  SR ICD Dependent:  No       ICD Device Measurements Right Ventricle:  Amplitude: 12.0 mV, Impedance: 480 ohms, Threshold: 0.75 V at 0.5 msec Shock Impedance: 41 ohms   Episodes MS Episodes:  0     Coumadin:  No Shock:  0     ATP:  0     Nonsustained:  0     Atrial Therapies:  0 Ventricular Pacing:  2.8%  Brady Parameters Mode VVI     Lower Rate Limit:  40      Tachy Zones VF:  240     VT:  200     VT1:  173     Next Remote Date:  06/04/2010     Next Cardiology Appt Due:  11/09/2010 Tech Comments:  NORMAL DEVICE FUNCTION.  NO EPISODES SINCE LAST CHECK.  NO CHANGES MADE. ROV IN JAN 2012 W/GT. Vella Kohler  March 05, 2010 10:12 AM

## 2010-06-11 NOTE — Letter (Signed)
Summary: [facility]   Imported By: Sherian Rein 05/20/2009 12:13:04  _____________________________________________________________________  External Attachment:    Type:   Image     Comment:   External Document

## 2010-06-17 NOTE — Assessment & Plan Note (Signed)
Summary: f/u 23m/hms   Vital Signs:  Patient profile:   70 year old male Height:      71 inches Weight:      253 pounds BMI:     35.41 Pulse rate:   70 / minute Pulse (ortho):   63 / minute BP sitting:   134 / 62  (left arm) BP standing:   97 / 58 Cuff size:   regular  Vitals Entered By: Hardin Negus, RMA (June 11, 2010 2:11 PM)  Serial Vital Signs/Assessments:  Time      Position  BP       Pulse  Resp  Temp     By           Lying RA  126/70   [address] [place]           Sitting   118/71   [address], [place]           Standing  97/58    63                    Hardin Negus, [place]  Comments: 2 mins BP 91/58  P 67 4 mins BP 83/61  P 66  Sitting -- little fuzzy  Standing -- slightly 'wavey' By: Hardin Negus, RMA    Visit Type:  Follow-up Referring Provider:  Zenovia Jordan, MD Primary Provider:  Jacques Navy MD  CC:  dizziness.  History of Present Illness: The patient is here for followup of his multiple cardiac problems.  Recently his dizziness has been the largest problem.  On several occasions he has become dizzy and fallen.  He has bruises arm.  He's had no head injuries.  He cannot predict when this will occur.  There may be an orthostatic component to it.  He does not have any significant room was spinning.  Recently his volume status has been stable.  He has not been having chest pain or shortness of breath.    Current Medications (verified): 1)  Carvedilol 6.25 Mg Tabs (Carvedilol) .... Take One Tablet By Mouth Twice A Day 2)  Furosemide 40 Mg Tabs (Furosemide) .... Take 1 Tablet Two Times A Day Extra As Needed 3)  Klor-Con M20 20 Meq  Tbcr (Potassium Chloride Crys Cr) .Marland Kitchen.. 1 By Mouth Two Times A Day 4)  Prilosec Otc 20 Mg Tbec (Omeprazole Magnesium) .... Take One Tablet By Mouth Once Daily. 5)  Aspirin Ec 325 Mg Tbec (Aspirin) .... Take One Tablet By Mouth Daily 6)  Allopurinol 300 Mg Tabs (Allopurinol)  .... Take One Tablet By Mouth Once Daily Along With 100mg  Tab 7)  Restasis 0.05 %  Emul (Cyclosporine) .... One Drop Each Eye Two Times A Day 8)  Ocuvite Preservision  Tabs (Multiple Vitamins-Minerals) .Marland Kitchen.. 1 Two Times A Day 9)  Flomax 0.4 Mg Xr24h-Cap (Tamsulosin Hcl) .... Take One Tablet By Mouth Once Daily. 10)  Nasal Moisturizing Spray 0.65 % Soln (Saline) .... Prn 11)  Alprazolam 0.25 Mg Tabs (Alprazolam) .... As Needed 12)  Plavix 75 Mg Tabs (Clopidogrel Bisulfate) .Marland Kitchen.. 1 By Mouth Daily 13)  Nitrostat 0.4 Mg Subl (Nitroglycerin) .... As Needed 14)  Acetaminophen 325 Mg  Tabs (Acetaminophen) .... As Needed 15)  Hydralazine Hcl 100  Mg Tabs (Hydralazine Hcl) .... 3/4 Tablet Every Evening, Hold Am Dose 16)  Gabapentin 300 Mg Caps (Gabapentin) .... Take 1 Capsule By Mouth Three Times A Day 17)  Metolazone 2.5 Mg Tabs (Metolazone) .... Take One Tablet By Mouth Daily As Directed 18)  Colcrys 0.6 Mg Tabs (Colchicine) .Marland Kitchen.. 1 Tab Three Times A Day As Needed 19)  Multivitamins   Tabs (Multiple Vitamin) .... Once Daily 20)  Mucinex D 60-600 Mg Xr12h-Tab (Pseudoephedrine-Guaifenesin) .... Daily As Needed Fofr Thick, Sticky Mucus 21)  Antivert 25 Mg Tabs (Meclizine Hcl) .Marland Kitchen.. 1 By Mouth Every 4 Hours As Needed For Dizzy Symptoms 22)  Fluticasone Propionate 50 Mcg/act Susp (Fluticasone Propionate) .... 2 Spray/side Once Daily 23)  Tramadol Hcl 50 Mg Tabs (Tramadol Hcl) .Marland Kitchen.. 1-2 By Mouth Q 6 Hrs As Needed 24)  Bethanechol Chloride 25 Mg Tabs (Bethanechol Chloride) .... Three Times A Day 25)  Hycodan 1.5mg  Per 5 Ml .... Take Every 6-8hours 26)  Allopurinol 100 Mg Tabs (Allopurinol) .... Take 1 Tab Daily With 300mg  Tablet To Make 400mg   Allergies (verified): 1)  ! Cortisone 2)  ! Augmentin 3)  ! 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 severe native disease... LIMA to the LAD patent, SVG to OM patent but sequential grafts to the posterior lateral of the OM and then the posterior lateral of the right occluded, SVG to diagonal occluded SVG to the right coronary artery occluded.... this was unchanged since 2006...  /   hospitalization...1.2011.Marland Kitchen elevated troponin/CHF.Marland Kitchen no work-up ischemic cardiomyopathy ICD  placed  05/12/2009 EF 40%  echo..01/2006..inferior/posterior akinesis /  30% echo... June, 2010... technically limited study A/C systolic [facility]..diuresed Combative in hospital  05/2009...treated  haldol..... Carotid aretery disease...doppler..04/2007.Marland Kitchen0-39% RICA.Marland Kitchen60-79%  LICA  /   Doppler... March 21, 2009... 0-39% R. ICA, 60-79% LICA... unchanged he is no rebound Abdominal bruit but no abdominal aortic aneurysm and no renal artery stenosis Grief over wife having severe Alzheimer's bradycardia mild not symptomatic Gout.Marland Kitchenallopurinol and p.r.n. colchicine Hypertension Nephrolithiasis, hx of Prostatitis, hx of  Hyperlipidemia.Marland Kitchenintolerant to all cholesterol medications Cholelithiasis Chronic kidney disease.. Dr. Elvis Coil  /  creatinine elevated  June 11, 2010 History brain tumor removed successfully in the past  Niaspan intolerance melanoma...Marland Kitchenon his back... removed at [facility] in the past Aortic root dilitation...mild.. Mild musculoskeletal back pain Renal and liver cysts on CT scan Chronic mild dizziness   worse.. July, 2011 Augmentin allergy. swelling  tongue Renal insufficiency...  Orthostatic dizziness.. Carotid bruit           Cardiology - Myrtis Ser          GU - Ron Earlene Plater          Derm - Dr. Mayford Knife in [place]          Vasc Surgeon - Madilyn Fireman          GI - Yancey Flemings         GS - Gross  Review of Systems       Patient denies fever, chills, headache sweats, rash, change in vision, change in hearing, chest pain, cough,  nausea vomiting, urinary symptoms.  All of the systems are reviewed and are negative.  Physical Exam  General:  patient is stable today.  He is a little tearful.  He has a bruise  on his left arm. Head:  head is atraumatic. Eyes:  no xanthelasma. Neck:  no jugular venous distention.  There is a left carotid bruit. Chest Wall:  no chest wall tenderness. Lungs:  lungs are clear.  Respiratory effort is nonlabored. Heart:  cardiac exam reveals S1-S2 with a soft systolic murmur. Abdomen:  abdomen is soft. Msk:  no musculoskeletal deformities. Extremities:  there is a bruise on the left arm.  He has areas of ecchymoses. Skin:  there are areas of ecchymoses. Psych:  patient is oriented to person time and place.  Affect is normal.   Impression & Recommendations:  Problem # 1:  DEPRESSIVE DISORDER NOT ELSEWHERE CLASSIFIED (ICD-311) The patient is having enormous stress in his life.  I believe this affects him overall.  However I do not believe that this is the basis for his dizziness and falling  Problem # 2:  AUTOMATIC IMPLANTABLE CARDIAC DEFIBRILLATOR SITU (ICD-V45.02) The ICD is in place.  It is to be interrogated.  Problem # 3:  RENAL INSUFFICIENCY (ICD-588.9) Chemistry will be checked today to reassess his renal function. I have obtained lab results from another office drawn earlier today.  BUN is 63 and creatinine 2.2.  This is increased for him.  His diuretic dose will be decreased.  Problem # 4:  EDEMA (ICD-782.3) He does not have any significant edema at this time.  Problem # 5:  CAD (ICD-414.00) Coronary disease is stable.  I do not believe he is having active ischemia.  Problem # 6:  CHRONIC SYSTOLIC HEART FAILURE (ICD-428.22) His heart failure appears to be under control at this time.  Problem # 7:  CAROTID ARTERY DISEASE (ICD-433.10) A bruit is heard today.  There is no documentation of carotid Doppler in several years.  This will be done.  Problem # 8:  CARDIOMYOPATHY,  ISCHEMIC (ICD-414.8) This point the patient is on all the medicines he can tolerate.  No further change.  Problem # 9:  DIZZINESS (ICD-780.4)  The patient's orthostatic pressures were checked very carefully.  His heart rate remained in the range of 60-64.  His systolic pressure went from 130 supine to 81 systolic after standing for several minutes.  He did not have significant dizziness with this.  He had mild dizziness.  However he clearly shows that he still has a significant orthostatic component.  His ICD is single chamber and was interrogated.  The backup rate is set at 40.  I feel it will not be to his advantage to begin to pace him from the right ventricle 100% of the time.  This might lead to heart failure.  He is only pacing 3% of the time at this point.  His carvedilol will be stopped.  A small dose of hydralazine was stopped.  I am very hesitant to adjust his diuretic as he has had multiple admissions for fluid overload.  I may consider giving him a small dose of Midrin to be taken once a day.  Orders: Carotid Duplex (Carotid Duplex)  Patient Instructions: 1)  Your physician has requested that you have a carotid duplex. This test is an ultrasound of the carotid arteries in your neck. It looks at blood flow through these arteries that supply the brain with blood. Allow one hour for this exam. There are no restrictions or special instructions. 2)  Stop Carvedilol 3)  Stop Hydralazine 4)  Start Mididrine 5mg  1 tab every AM and 1 tab at 2pm 5)  Give me a call in  about a week to let me know how your doing 6)  Follow up in 4 weeks--Fri 2/24 at 10:30am Prescriptions: MIDODRINE HCL 5 MG TABS (MIDODRINE HCL) Take 1 tab every AM and 1 tab at 2pm  #60 x 6   Entered by:   Meredith Staggers, RN   Authorized by:   Talitha Givens, MD, [facility]   Signed by:   Meredith Staggers, RN on 06/11/2010   Method used:   Electronically to        [facility]  [address]* (retail)       [address]       [place], [place]  [address]       Ph: [phone]       Fax: [phone]   RxID:   [id number] MIDODRINE HCL 5 MG TABS (MIDODRINE HCL) Take 1 tab every AM and 1 tab at 2pm  #60 x 6   Entered by:   Meredith Staggers, RN   Authorized by:   Talitha Givens, MD, [facility]   Signed by:   Meredith Staggers, RN on 06/11/2010   Method used:   Electronically to        [facility]* (retail)       [address]       [place], [place]  [address]       Ph: [phone]       Fax: [phone]   RxID:   [id number]    Orders Added: 1)  Carotid Duplex [Carotid Duplex]     Appended Document: f/u 95m/hms called pt and advised we needed to decrease Furosemide to 40mg  once daily and recheck bmet in 1 week, he is aware, will change med and will come next friday for labs   Clinical Lists Changes  Medications: Changed medication from FUROSEMIDE 40 MG TABS (FUROSEMIDE) take 1 tablet two times a day extra as needed to FUROSEMIDE 40 MG TABS (FUROSEMIDE) Take 1 tab daily

## 2010-06-19 ENCOUNTER — Other Ambulatory Visit (INDEPENDENT_AMBULATORY_CARE_PROVIDER_SITE_OTHER): Payer: Medicare Other

## 2010-06-19 ENCOUNTER — Other Ambulatory Visit: Payer: Self-pay | Admitting: Internal Medicine

## 2010-06-19 ENCOUNTER — Encounter: Payer: Self-pay | Admitting: Internal Medicine

## 2010-06-19 DIAGNOSIS — N259 Disorder resulting from impaired renal tubular function, unspecified: Secondary | ICD-10-CM

## 2010-06-19 LAB — BASIC METABOLIC PANEL
CO2: 32 mEq/L (ref 19–32)
Calcium: 8.4 mg/dL (ref 8.4–10.5)
GFR: 38.42 mL/min — ABNORMAL LOW (ref >60.00)
Potassium: 4.2 mEq/L (ref 3.5–5.1)
Sodium: 145 mEq/L (ref 135–145)

## 2010-06-22 ENCOUNTER — Telehealth (INDEPENDENT_AMBULATORY_CARE_PROVIDER_SITE_OTHER): Payer: Self-pay | Admitting: *Deleted

## 2010-06-25 NOTE — Letter (Signed)
Summary: Zenovia Jordan MD/[facility] MD/[facility]   Imported By: [facility] 06/18/2010 09:31:38  _____________________________________________________________________  External Attachment:    Type:   Image     Comment:   External Document

## 2010-06-25 NOTE — Procedures (Signed)
Summary: [facility]      Allergies Added:   Current Medications (verified): 1)  Carvedilol 6.25 Mg Tabs (Carvedilol) .... Take One Tablet By Mouth Twice A Day 2)  Furosemide 40 Mg Tabs (Furosemide) .... Take 1 Tablet Two Times A Day Extra As Needed 3)  Klor-Con M20 20 Meq  Tbcr (Potassium Chloride Crys Cr) .Marland Kitchen.. 1 By Mouth Two Times A Day 4)  Prilosec Otc 20 Mg Tbec (Omeprazole Magnesium) .... Take One Tablet By Mouth Once Daily. 5)  Aspirin Ec 325 Mg Tbec (Aspirin) .... Take One Tablet By Mouth Daily 6)  Allopurinol 300 Mg Tabs (Allopurinol) .... Take One Tablet By Mouth Once Daily Along With 100mg  Tab 7)  Restasis 0.05 %  Emul (Cyclosporine) .... One Drop Each Eye Two Times A Day 8)  Ocuvite Preservision  Tabs (Multiple Vitamins-Minerals) .Marland Kitchen.. 1 Two Times A Day 9)  Flomax 0.4 Mg Xr24h-Cap (Tamsulosin Hcl) .... Take One Tablet By Mouth Once Daily. 10)  Nasal Moisturizing Spray 0.65 % Soln (Saline) .... Prn 11)  Alprazolam 0.25 Mg Tabs (Alprazolam) .... As Needed 12)  Plavix 75 Mg Tabs (Clopidogrel Bisulfate) .Marland Kitchen.. 1 By Mouth Daily 13)  Nitrostat 0.4 Mg Subl (Nitroglycerin) .... As Needed 14)  Acetaminophen 325 Mg  Tabs (Acetaminophen) .... As Needed 15)  Hydralazine Hcl 100 Mg Tabs (Hydralazine Hcl) .... 3/4 Tablet Every Evening, Hold Am Dose 16)  Gabapentin 300 Mg Caps (Gabapentin) .... Take 1 Capsule By Mouth Three Times A Day 17)  Metolazone 2.5 Mg Tabs (Metolazone) .... Take One Tablet By Mouth Daily As Directed 18)  Colcrys 0.6 Mg Tabs (Colchicine) .Marland Kitchen.. 1 Tab Three Times A Day As Needed 19)  Multivitamins   Tabs (Multiple Vitamin) .... Once Daily 20)  Mucinex D 60-600 Mg Xr12h-Tab (Pseudoephedrine-Guaifenesin) .... Daily As Needed Fofr Thick, Sticky Mucus 21)  Antivert 25 Mg Tabs (Meclizine Hcl) .Marland Kitchen.. 1 By Mouth Every 4 Hours As Needed For Dizzy Symptoms 22)  Fluticasone Propionate 50 Mcg/act Susp (Fluticasone Propionate) .... 2 Spray/side Once Daily 23)  Tramadol Hcl 50  Mg Tabs (Tramadol Hcl) .Marland Kitchen.. 1-2 By Mouth Q 6 Hrs As Needed 24)  Bethanechol Chloride 25 Mg Tabs (Bethanechol Chloride) .... Three Times A Day 25)  Hycodan 1.5mg  Per 5 Ml .... Take Every 6-8hours 26)  Allopurinol 100 Mg Tabs (Allopurinol) .... Take 1 Tab Daily With 300mg  Tablet To Make 400mg   Allergies (verified): 1)  ! Cortisone 2)  ! Augmentin 3)  ! Nsaids   ICD Specifications Following MD:  Lewayne Bunting, MD     ICD Vendor:  St Jude     ICD Model Number:  [id number]     ICD Serial Number:  [id number] ICD DOI:  05/12/2009     ICD Implanting MD:  Lewayne Bunting, MD  Lead 1:    Location: RV     DOI: 05/12/2009     Model #: 8119     Serial #: [id number]     Status: active  ICD Follow Up Battery Voltage:  90% V     Charge Time:  8.9 seconds     Battery Est. Longevity:  8.9-9.2 yrs Underlying rhythm:  SR ICD Dependent:  No       ICD Device Measurements Right Ventricle:  Amplitude: 12.0 mV, Impedance: 540 ohms, Threshold: 0.75 V at 0.5 msec Shock Impedance: 47 ohms   Episodes MS Episodes:  0     Coumadin:  No Shock:  0     ATP:  0     Nonsustained:  0     Atrial Therapies:  0 Ventricular Pacing:  3.6%  Brady Parameters Mode VVI     Lower Rate Limit:  40      Tachy Zones VF:  240     VT:  200     VT1:  173     Next Cardiology Appt Due:  09/08/2010 Tech Comments:  NORMAL DEVICE FUNCTION.  NO EPISODES SINCE LAST CHECK.  NO CHANGES MADE. ROV IN 3 MTHS W/GT. Vella Kohler  June 11, 2010 2:53 PM

## 2010-06-25 NOTE — Cardiovascular Report (Signed)
Summary: Office Visit   Office Visit   Imported By: Roderic Ovens 06/18/2010 16:11:44  _____________________________________________________________________  External Attachment:    Type:   Image     Comment:   External Document

## 2010-06-28 ENCOUNTER — Encounter: Payer: Self-pay | Admitting: Internal Medicine

## 2010-07-01 NOTE — Progress Notes (Signed)
   Walk in Patient Form Recieved " Pt had Labs drawn on 2/10.Marland KitchenMarland KitchenHe is upset because he could of had Labs drawn in [place] please call Pt" sent to [facility]  June 22, 2010 9:17 AM

## 2010-07-03 ENCOUNTER — Encounter: Payer: Self-pay | Admitting: Cardiology

## 2010-07-03 ENCOUNTER — Ambulatory Visit (INDEPENDENT_AMBULATORY_CARE_PROVIDER_SITE_OTHER): Payer: Medicare Other | Admitting: Cardiology

## 2010-07-03 DIAGNOSIS — I951 Orthostatic hypotension: Secondary | ICD-10-CM

## 2010-07-07 NOTE — Letter (Signed)
   [facility] [address] [place], [place]  [address] Phone: [phone]      June 29, 2010   [name] [address] [place], [place] [address]  RE:  LAB RESULTS  Dear  Mr. Elsey,  The following is an interpretation of your most recent lab tests.  Please take note of any instructions provided or changes to medications that have resulted from your lab work.  ELECTROLYTES:  Good - no changes needed  KIDNEY FUNCTION TESTS:  Stable - no changes needed   labs ordered by cardiology that came to me:  renal/kidney function - creatinine is up a little from 1.7 to 1.9.  Call or e-mail me if you have questions ([email or web address])    Sincerely Yours,    Jacques Navy MD  Tests: (1) BMP (METABOL)   Sodium                    145 mEq/L                   135-145   Potassium                 4.2 mEq/L                   3.5-5.1   Chloride                  108 mEq/L                   96-112   Carbon Dioxide            32 mEq/L                    19-32   Glucose              [H]  101 mg/dL                   27-25   BUN                       22 mg/dL                    3-66   Creatinine           [H]  1.9 mg/dL                   4.4-0.3   Calcium                   8.4 mg/dL                   4.7-42.5   GFR                  [L]  38.42 mL/min                >60.00

## 2010-07-07 NOTE — Assessment & Plan Note (Signed)
Summary: f/u 4wks/hms      Allergies Added:   Visit Type:  Follow-up Referring Provider:  Zenovia Jordan, MD Primary Provider:  Jacques Navy MD  CC:  orthostatic hypotension.  History of Present Illness: The patient is seen for cardiology followup.  He has a multitude of cardiology problems.  I saw him last June 11, 2010.  Carotid Doppler that day showed no significant change.  He did have signs of orthostatic hypotension.  His meds were adjusted by decreasing his diuretic dose.  I also added a low dose of Midodrine.  He feels great.  He has not had any significant symptoms of dizziness since his last visit.  I'm very pleased about this.  We need to be very careful that he does not become fluid overloaded.  Current Medications (verified): 1)  Furosemide 40 Mg Tabs (Furosemide) .... Take 1 Tab Daily 2)  Klor-Con M20 20 Meq  Tbcr (Potassium Chloride Crys Cr) .Marland Kitchen.. 1 By Mouth Two Times A Day 3)  Prilosec Otc 20 Mg Tbec (Omeprazole Magnesium) .... Take One Tablet By Mouth Once Daily. 4)  Aspirin Ec 325 Mg Tbec (Aspirin) .... Take One Tablet By Mouth Daily 5)  Allopurinol 300 Mg Tabs (Allopurinol) .... Take One Tablet By Mouth Once Daily Along With 100mg  Tab 6)  Restasis 0.05 %  Emul (Cyclosporine) .... One Drop Each Eye Two Times A Day 7)  Ocuvite Preservision  Tabs (Multiple Vitamins-Minerals) .Marland Kitchen.. 1 Two Times A Day 8)  Flomax 0.4 Mg Xr24h-Cap (Tamsulosin Hcl) .... Take One Tablet By Mouth Once Daily. 9)  Nasal Moisturizing Spray 0.65 % Soln (Saline) .... Prn 10)  Alprazolam 0.25 Mg Tabs (Alprazolam) .... As Needed 11)  Plavix 75 Mg Tabs (Clopidogrel Bisulfate) .Marland Kitchen.. 1 By Mouth Daily 12)  Nitrostat 0.4 Mg Subl (Nitroglycerin) .... As Needed 13)  Acetaminophen 325 Mg  Tabs (Acetaminophen) .... As Needed 14)  Gabapentin 300 Mg Caps (Gabapentin) .... Take 1 Capsule By Mouth Three Times A Day 15)  Metolazone 2.5 Mg Tabs (Metolazone) .... Take One Tablet By Mouth Daily As Directed 16)   Colcrys 0.6 Mg Tabs (Colchicine) .Marland Kitchen.. 1 Tab Three Times A Day As Needed 17)  Multivitamins   Tabs (Multiple Vitamin) .... Once Daily 18)  Mucinex D 60-600 Mg Xr12h-Tab (Pseudoephedrine-Guaifenesin) .... Daily As Needed Fofr Thick, Sticky Mucus 19)  Antivert 25 Mg Tabs (Meclizine Hcl) .Marland Kitchen.. 1 By Mouth Every 4 Hours As Needed For Dizzy Symptoms 20)  Fluticasone Propionate 50 Mcg/act Susp (Fluticasone Propionate) .... 2 Spray/side Once Daily 21)  Tramadol Hcl 50 Mg Tabs (Tramadol Hcl) .Marland Kitchen.. 1-2 By Mouth Q 6 Hrs As Needed 22)  Bethanechol Chloride 25 Mg Tabs (Bethanechol Chloride) .... Three Times A Day 23)  Hycodan 1.5mg  Per 5 Ml .... Take Every 6-8hours 24)  Allopurinol 100 Mg Tabs (Allopurinol) .... Take 1 Tab Daily With 300mg  Tablet To Make 400mg  25)  Midodrine Hcl 5 Mg Tabs (Midodrine Hcl) .... Take 1 Tab Every Am and 1 Tab At 2pm  Allergies (verified): 1)  ! Cortisone 2)  ! Augmentin 3)  ! 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 severe native disease... LIMA to the LAD patent, SVG to OM patent but sequential grafts to the posterior lateral of the OM and then the posterior lateral of  the right occluded, SVG to diagonal occluded SVG to the right coronary artery occluded.... this was unchanged since 2006...  /   hospitalization...1.2011.Marland Kitchen elevated troponin/CHF.Marland Kitchen no work-up ischemic cardiomyopathy ICD  placed  05/12/2009 EF 40%  echo..01/2006..inferior/posterior akinesis /  30% echo... June, 2010... technically limited study A/C systolic [facility]..diuresed Combative in hospital  05/2009...treated  haldol..... Carotid aretery disease...doppler..04/2007.Marland Kitchen0-39% RICA.Marland Kitchen60-79%  LICA  /   Doppler... March 21, 2009... 0-39% R. ICA, 60-79% LICA...  unchanged  /   Doppler... February, 2012.... 30-90% RICA, 60-79% LICA Abdominal bruit but no abdominal aortic aneurysm and no renal artery stenosis Grief over wife having severe Alzheimer's bradycardia  asymptomatic Gout.Marland Kitchenallopurinol and p.r.n. colchicine Hypertension Nephrolithiasis, hx of Prostatitis, hx of  Hyperlipidemia.Marland Kitchenintolerant to all cholesterol medications Cholelithiasis Chronic kidney disease.. Dr. Elvis Coil  /  creatinine elevated  June 11, 2010 History brain tumor removed successfully in the past  Niaspan intolerance melanoma...Marland Kitchenon his back... removed at [facility] in the past Aortic root dilitation...mild.. Mild musculoskeletal back pain Renal and liver cysts on CT scan Chronic mild dizziness   worse.. July, 2011 Augmentin allergy. swelling  tongue Renal insufficiency...  Orthostatic dizziness..            Cardiology - Myrtis Ser          GU - Ron Earlene Plater          Derm - Dr. Mayford Knife in [place]          Vasc Surgeon - Madilyn Fireman          GI - Yancey Flemings         GS - Gross  Review of Systems       Patient denies fever, chills, headache, rash, change in vision, change in hearing, chest pain, cough, nausea vomiting, urinary symptoms.  All other systems are reviewed and are negative.  Vital Signs:  Patient profile:   70 year old male Height:      71 inches Weight:      255 pounds BMI:     35.69 Pulse rate:   65 / minute BP sitting:   134 / 80  (right arm) Cuff size:   large  Vitals Entered By: Hardin Negus, RMA (July 03, 2010 10:40 AM)  Physical Exam  General:  he looks great today. Eyes:  no xanthelasma. Neck:  no jugular venous extension. Lungs:  lungs are clear cardiorespiratory effort is not labored. Heart:  cardiac exam reveals a fullness to have a soft systolic murmur. Abdomen:  abdomen soft. Extremities:  no significant peripheral edema today. Psych:  patient is oriented to person time and place.  Affect is normal.    ICD Specifications Following  MD:  Lewayne Bunting, MD     ICD Vendor:  [employer] Jude     ICD Model Number:  [id number]     ICD Serial Number:  [id number] ICD DOI:  05/12/2009     ICD Implanting MD:  Lewayne Bunting, MD  Lead 1:    Location: RV     DOI: 05/12/2009     Model #: [id number]     Serial #: [id number]     Status: active  ICD Follow Up ICD Dependent:  No      Episodes Coumadin:  No  Brady Parameters Mode VVI     Lower Rate Limit:  40      Tachy Zones VF:  240     VT:  200     VT1:  173     Impression &  Recommendations:  Problem # 1:  CAROTID BRUIT (ICD-785.9) The patient has had followup carotid Dopplers.  His carotids are stable.  Problem # 2:  HYPOTENSION, ORTHOSTATIC (ICD-458.0) Clinically this is much improved on the lower dose of diuretics and with the addition of low dose midodrine.  Problem # 3:  RENAL INSUFFICIENCY (ICD-588.9) His creatinine was up to 2.2.  Followup was 1.9 after his diuretic was decreased.  Problem # 4:  GOUT (ICD-274.9) He has been seen by his rheumatologist and his gout is quite stable.  He is stable today.  No change in his meds.  3 month followup.  Patient Instructions: 1)  Your physician recommends that you schedule a 3 month  follow-up appointment in:  09-25-10 at 10:00am with Dr. Myrtis Ser  2)  Your physician recommends that you continue on your current medications as directed. Please refer to the Current Medication list given to you today. 3)  Your physician recommends that you weigh, daily, at the same time every day, and in the same amount of clothing.  Please record your daily weights on the handout provided and bring it to your next appointment.

## 2010-07-21 NOTE — Letter (Signed)
Summary: [facility] Visit Note   [facility] Office Visit Note   Imported By: Roderic Ovens 07/13/2010 16:11:10  _____________________________________________________________________  External Attachment:    Type:   Image     Comment:   External Document

## 2010-07-26 LAB — HEPATIC FUNCTION PANEL
ALT: 15 U/L (ref 0–53)
AST: 21 U/L (ref 0–37)
Albumin: 4.1 g/dL (ref 3.5–5.2)
Alkaline Phosphatase: 52 U/L (ref 39–117)
Alkaline Phosphatase: 52 U/L (ref 39–117)
Bilirubin, Direct: 0.3 mg/dL (ref 0.0–0.3)
Bilirubin, Direct: 0.3 mg/dL (ref 0.0–0.3)
Bilirubin, Direct: 0.4 mg/dL — ABNORMAL HIGH (ref 0.0–0.3)
Indirect Bilirubin: 1.1 mg/dL — ABNORMAL HIGH (ref 0.3–0.9)
Indirect Bilirubin: 1.2 mg/dL — ABNORMAL HIGH (ref 0.3–0.9)
Indirect Bilirubin: 1.4 mg/dL — ABNORMAL HIGH (ref 0.3–0.9)
Total Bilirubin: 1.4 mg/dL — ABNORMAL HIGH (ref 0.3–1.2)
Total Bilirubin: 1.5 mg/dL — ABNORMAL HIGH (ref 0.3–1.2)
Total Protein: 7.7 g/dL (ref 6.0–8.3)
Total Protein: 7.8 g/dL (ref 6.0–8.3)

## 2010-07-26 LAB — BASIC METABOLIC PANEL
BUN: 31 mg/dL — ABNORMAL HIGH (ref 6–23)
CO2: 30 mEq/L (ref 19–32)
CO2: 33 mEq/L — ABNORMAL HIGH (ref 19–32)
Calcium: 8.5 mg/dL (ref 8.4–10.5)
Calcium: 8.7 mg/dL (ref 8.4–10.5)
Chloride: 102 mEq/L (ref 96–112)
Chloride: 103 mEq/L (ref 96–112)
Chloride: 105 mEq/L (ref 96–112)
Chloride: 106 mEq/L (ref 96–112)
Creatinine, Ser: 1.64 mg/dL — ABNORMAL HIGH (ref 0.4–1.5)
Creatinine, Ser: 1.74 mg/dL — ABNORMAL HIGH (ref 0.4–1.5)
Creatinine, Ser: 1.75 mg/dL — ABNORMAL HIGH (ref 0.4–1.5)
GFR calc Af Amer: 47 mL/min — ABNORMAL LOW (ref >60)
GFR calc Af Amer: 47 mL/min — ABNORMAL LOW (ref >60)
GFR calc Af Amer: 47 mL/min — ABNORMAL LOW (ref >60)
GFR calc Af Amer: 47 mL/min — ABNORMAL LOW (ref >60)
GFR calc Af Amer: 50 mL/min — ABNORMAL LOW (ref >60)
GFR calc non Af Amer: 39 mL/min — ABNORMAL LOW (ref >60)
GFR calc non Af Amer: 39 mL/min — ABNORMAL LOW (ref >60)
GFR calc non Af Amer: 39 mL/min — ABNORMAL LOW (ref >60)
GFR calc non Af Amer: 41 mL/min — ABNORMAL LOW (ref >60)
Glucose, Bld: 108 mg/dL — ABNORMAL HIGH (ref 70–99)
Potassium: 3.6 mEq/L (ref 3.5–5.1)
Potassium: 3.8 mEq/L (ref 3.5–5.1)
Sodium: 142 mEq/L (ref 135–145)
Sodium: 142 mEq/L (ref 135–145)
Sodium: 144 mEq/L (ref 135–145)
Sodium: 145 mEq/L (ref 135–145)

## 2010-07-26 LAB — CBC
HCT: 35.4 % — ABNORMAL LOW (ref 39.0–52.0)
HCT: 36 % — ABNORMAL LOW (ref 39.0–52.0)
Hemoglobin: 11.9 g/dL — ABNORMAL LOW (ref 13.0–17.0)
Hemoglobin: 12.4 g/dL — ABNORMAL LOW (ref 13.0–17.0)
MCHC: 33.4 g/dL (ref 30.0–36.0)
MCV: 96.5 fL (ref 78.0–100.0)
MCV: 96.6 fL (ref 78.0–100.0)
Platelets: 117 10*3/uL — ABNORMAL LOW (ref 150–400)
RBC: 3.65 MIL/uL — ABNORMAL LOW (ref 4.22–5.81)
RBC: 3.73 MIL/uL — ABNORMAL LOW (ref 4.22–5.81)
RBC: 3.73 MIL/uL — ABNORMAL LOW (ref 4.22–5.81)
RBC: 3.79 MIL/uL — ABNORMAL LOW (ref 4.22–5.81)
RDW: 15.7 % — ABNORMAL HIGH (ref 11.5–15.5)
WBC: 10.9 10*3/uL — ABNORMAL HIGH (ref 4.0–10.5)
WBC: 14.1 10*3/uL — ABNORMAL HIGH (ref 4.0–10.5)

## 2010-07-26 LAB — URINALYSIS, MICROSCOPIC ONLY
Bilirubin Urine: NEGATIVE
Glucose, UA: NEGATIVE mg/dL
Hgb urine dipstick: NEGATIVE
Ketones, ur: NEGATIVE mg/dL
Leukocytes, UA: NEGATIVE
Protein, ur: NEGATIVE mg/dL
Specific Gravity, Urine: 1.012 (ref 1.005–1.030)
Urobilinogen, UA: 0.2 mg/dL (ref 0.0–1.0)
pH: 7 (ref 5.0–8.0)

## 2010-07-26 LAB — APTT: aPTT: 31 seconds (ref 24–37)

## 2010-07-26 LAB — POCT CARDIAC MARKERS
CKMB, poc: 4.1 ng/mL (ref 1.0–8.0)
Myoglobin, poc: 165 ng/mL (ref 12–200)
Troponin i, poc: <0.05 ng/mL (ref 0.00–0.09)
Troponin i, poc: <0.05 ng/mL (ref 0.00–0.09)

## 2010-07-26 LAB — CARDIAC PANEL(CRET KIN+CKTOT+MB+TROPI)
CK, MB: 10.3 ng/mL (ref 0.3–4.0)
CK, MB: 20.6 ng/mL (ref 0.3–4.0)
Relative Index: 19.8 — ABNORMAL HIGH (ref 0.0–2.5)
Relative Index: INVALID (ref 0.0–2.5)
Troponin I: 1.14 ng/mL (ref 0.00–0.06)
Troponin I: 1.59 ng/mL (ref 0.00–0.06)

## 2010-07-26 LAB — DIFFERENTIAL
Basophils Absolute: 0 10*3/uL (ref 0.0–0.1)
Eosinophils Relative: 1 % (ref 0–5)
Lymphocytes Relative: 9 % — ABNORMAL LOW (ref 12–46)
Neutro Abs: 8.7 10*3/uL — ABNORMAL HIGH (ref 1.7–7.7)
Neutrophils Relative %: 85 % — ABNORMAL HIGH (ref 43–77)

## 2010-07-26 LAB — ALBUMIN: Albumin: 3.9 g/dL (ref 3.5–5.2)

## 2010-07-26 LAB — AMMONIA: Ammonia: 33 umol/L (ref 11–35)

## 2010-08-11 LAB — BASIC METABOLIC PANEL
CO2: 28 mEq/L (ref 19–32)
Chloride: 103 mEq/L (ref 96–112)
GFR calc Af Amer: 46 mL/min — ABNORMAL LOW (ref >60)
Glucose, Bld: 104 mg/dL — ABNORMAL HIGH (ref 70–99)
Sodium: 139 mEq/L (ref 135–145)

## 2010-08-11 LAB — CBC
HCT: 39.3 % (ref 39.0–52.0)
Hemoglobin: 13.1 g/dL (ref 13.0–17.0)
MCHC: 33.2 g/dL (ref 30.0–36.0)
MCHC: 34.2 g/dL (ref 30.0–36.0)
MCV: 96.4 fL (ref 78.0–100.0)
MCV: 97.1 fL (ref 78.0–100.0)
Platelets: 141 10*3/uL — ABNORMAL LOW (ref 150–400)
RBC: 4.05 MIL/uL — ABNORMAL LOW (ref 4.22–5.81)
RDW: 15.7 % — ABNORMAL HIGH (ref 11.5–15.5)
RDW: 15.9 % — ABNORMAL HIGH (ref 11.5–15.5)

## 2010-08-12 LAB — CBC
HCT: 32.5 % — ABNORMAL LOW (ref 39.0–52.0)
HCT: 32.5 % — ABNORMAL LOW (ref 39.0–52.0)
HCT: 36 % — ABNORMAL LOW (ref 39.0–52.0)
HCT: 36.5 % — ABNORMAL LOW (ref 39.0–52.0)
HCT: 36.8 % — ABNORMAL LOW (ref 39.0–52.0)
HCT: 37.6 % — ABNORMAL LOW (ref 39.0–52.0)
Hemoglobin: 12.1 g/dL — ABNORMAL LOW (ref 13.0–17.0)
Hemoglobin: 12.2 g/dL — ABNORMAL LOW (ref 13.0–17.0)
Hemoglobin: 12.2 g/dL — ABNORMAL LOW (ref 13.0–17.0)
Hemoglobin: 12.5 g/dL — ABNORMAL LOW (ref 13.0–17.0)
MCHC: 33.1 g/dL (ref 30.0–36.0)
MCHC: 33.7 g/dL (ref 30.0–36.0)
MCHC: 34 g/dL (ref 30.0–36.0)
MCV: 95.7 fL (ref 78.0–100.0)
MCV: 96 fL (ref 78.0–100.0)
MCV: 96.4 fL (ref 78.0–100.0)
MCV: 96.6 fL (ref 78.0–100.0)
Platelets: 109 10*3/uL — ABNORMAL LOW (ref 150–400)
Platelets: 140 10*3/uL — ABNORMAL LOW (ref 150–400)
Platelets: 140 10*3/uL — ABNORMAL LOW (ref 150–400)
Platelets: 155 10*3/uL (ref 150–400)
RBC: 3.39 MIL/uL — ABNORMAL LOW (ref 4.22–5.81)
RBC: 3.77 MIL/uL — ABNORMAL LOW (ref 4.22–5.81)
RBC: 3.8 MIL/uL — ABNORMAL LOW (ref 4.22–5.81)
RBC: 3.89 MIL/uL — ABNORMAL LOW (ref 4.22–5.81)
RDW: 15.5 % (ref 11.5–15.5)
RDW: 15.6 % — ABNORMAL HIGH (ref 11.5–15.5)
RDW: 15.8 % — ABNORMAL HIGH (ref 11.5–15.5)
WBC: 8.5 10*3/uL (ref 4.0–10.5)
WBC: 8.6 10*3/uL (ref 4.0–10.5)
WBC: 8.7 10*3/uL (ref 4.0–10.5)
WBC: 8.9 10*3/uL (ref 4.0–10.5)
WBC: 9 10*3/uL (ref 4.0–10.5)

## 2010-08-12 LAB — BASIC METABOLIC PANEL
BUN: 23 mg/dL (ref 6–23)
BUN: 26 mg/dL — ABNORMAL HIGH (ref 6–23)
BUN: 28 mg/dL — ABNORMAL HIGH (ref 6–23)
CO2: 29 mEq/L (ref 19–32)
CO2: 30 mEq/L (ref 19–32)
CO2: 31 mEq/L (ref 19–32)
Calcium: 9.2 mg/dL (ref 8.4–10.5)
Chloride: 105 mEq/L (ref 96–112)
Chloride: 106 mEq/L (ref 96–112)
Chloride: 107 mEq/L (ref 96–112)
Chloride: 107 mEq/L (ref 96–112)
Chloride: 108 mEq/L (ref 96–112)
Creatinine, Ser: 1.55 mg/dL — ABNORMAL HIGH (ref 0.4–1.5)
Creatinine, Ser: 1.56 mg/dL — ABNORMAL HIGH (ref 0.4–1.5)
Creatinine, Ser: 1.71 mg/dL — ABNORMAL HIGH (ref 0.4–1.5)
GFR calc Af Amer: 47 mL/min — ABNORMAL LOW (ref >60)
GFR calc Af Amer: 48 mL/min — ABNORMAL LOW (ref >60)
GFR calc Af Amer: 51 mL/min — ABNORMAL LOW (ref >60)
GFR calc Af Amer: 55 mL/min — ABNORMAL LOW (ref >60)
GFR calc non Af Amer: 39 mL/min — ABNORMAL LOW (ref >60)
GFR calc non Af Amer: 42 mL/min — ABNORMAL LOW (ref >60)
Glucose, Bld: 107 mg/dL — ABNORMAL HIGH (ref 70–99)
Glucose, Bld: 115 mg/dL — ABNORMAL HIGH (ref 70–99)
Glucose, Bld: 136 mg/dL — ABNORMAL HIGH (ref 70–99)
Potassium: 3.3 mEq/L — ABNORMAL LOW (ref 3.5–5.1)
Potassium: 3.9 mEq/L (ref 3.5–5.1)
Potassium: 4 mEq/L (ref 3.5–5.1)
Potassium: 4 mEq/L (ref 3.5–5.1)
Potassium: 4.1 mEq/L (ref 3.5–5.1)
Sodium: 141 mEq/L (ref 135–145)
Sodium: 142 mEq/L (ref 135–145)
Sodium: 143 mEq/L (ref 135–145)
Sodium: 144 mEq/L (ref 135–145)

## 2010-08-12 LAB — CARDIAC PANEL(CRET KIN+CKTOT+MB+TROPI)
CK, MB: 2.6 ng/mL (ref 0.3–4.0)
CK, MB: 3.1 ng/mL (ref 0.3–4.0)
CK, MB: 6 ng/mL — ABNORMAL HIGH (ref 0.3–4.0)
Relative Index: INVALID (ref 0.0–2.5)
Relative Index: INVALID (ref 0.0–2.5)
Relative Index: INVALID (ref 0.0–2.5)
Relative Index: INVALID (ref 0.0–2.5)
Total CK: 39 U/L (ref 7–232)
Total CK: 77 U/L (ref 7–232)
Total CK: 92 U/L (ref 7–232)
Troponin I: 0.2 ng/mL — ABNORMAL HIGH (ref 0.00–0.06)
Troponin I: 0.34 ng/mL — ABNORMAL HIGH (ref 0.00–0.06)
Troponin I: 0.82 ng/mL (ref 0.00–0.06)
Troponin I: 1.25 ng/mL (ref 0.00–0.06)

## 2010-08-12 LAB — TROPONIN I: Troponin I: 1.19 ng/mL (ref 0.00–0.06)

## 2010-08-12 LAB — CK TOTAL AND CKMB (NOT AT ARMC)
CK, MB: 8.3 ng/mL — ABNORMAL HIGH (ref 0.3–4.0)
Relative Index: INVALID (ref 0.0–2.5)

## 2010-08-12 LAB — COMPREHENSIVE METABOLIC PANEL
ALT: 12 U/L (ref 0–53)
AST: 16 U/L (ref 0–37)
Albumin: 3.3 g/dL — ABNORMAL LOW (ref 3.5–5.2)
Chloride: 104 mEq/L (ref 96–112)
Creatinine, Ser: 1.5 mg/dL (ref 0.4–1.5)
GFR calc Af Amer: 56 mL/min — ABNORMAL LOW (ref >60)
Sodium: 145 mEq/L (ref 135–145)
Total Bilirubin: 0.7 mg/dL (ref 0.3–1.2)

## 2010-08-12 LAB — HEPARIN LEVEL (UNFRACTIONATED)
Heparin Unfractionated: 0.13 IU/mL — ABNORMAL LOW (ref 0.30–0.70)
Heparin Unfractionated: 0.25 IU/mL — ABNORMAL LOW (ref 0.30–0.70)
Heparin Unfractionated: 0.36 IU/mL (ref 0.30–0.70)
Heparin Unfractionated: 0.43 IU/mL (ref 0.30–0.70)
Heparin Unfractionated: 0.53 IU/mL (ref 0.30–0.70)

## 2010-08-12 LAB — POCT I-STAT 3, VENOUS BLOOD GAS (G3P V)
Acid-Base Excess: 1 mmol/L (ref 0.0–2.0)
Bicarbonate: 26.4 mEq/L — ABNORMAL HIGH (ref 20.0–24.0)
O2 Saturation: 63 %
TCO2: 28 mmol/L (ref 0–100)
pH, Ven: 7.405 — ABNORMAL HIGH (ref 7.250–7.300)

## 2010-08-12 LAB — POCT CARDIAC MARKERS
Troponin i, poc: 0.46 ng/mL (ref 0.00–0.09)
Troponin i, poc: 0.79 ng/mL (ref 0.00–0.09)

## 2010-08-12 LAB — DIFFERENTIAL
Eosinophils Relative: 1 % (ref 0–5)
Lymphocytes Relative: 18 % (ref 12–46)
Lymphs Abs: 1.5 10*3/uL (ref 0.7–4.0)
Monocytes Absolute: 0.6 10*3/uL (ref 0.1–1.0)

## 2010-08-12 LAB — BRAIN NATRIURETIC PEPTIDE
Pro B Natriuretic peptide (BNP): 405 pg/mL — ABNORMAL HIGH (ref 0.0–100.0)
Pro B Natriuretic peptide (BNP): 473 pg/mL — ABNORMAL HIGH (ref 0.0–100.0)

## 2010-08-12 LAB — POCT I-STAT 3, ART BLOOD GAS (G3+)
Acid-Base Excess: 2 mmol/L (ref 0.0–2.0)
O2 Saturation: 96 %

## 2010-08-12 LAB — LIPID PANEL
LDL Cholesterol: 100 mg/dL — ABNORMAL HIGH (ref 0–99)
Total CHOL/HDL Ratio: 5.6 RATIO
Triglycerides: 123 mg/dL (ref <150)
VLDL: 25 mg/dL (ref 0–40)

## 2010-08-12 LAB — MAGNESIUM: Magnesium: 2.5 mg/dL (ref 1.5–2.5)

## 2010-08-17 LAB — POCT CARDIAC MARKERS
CKMB, poc: <1 ng/mL — ABNORMAL LOW (ref 1.0–8.0)
CKMB, poc: <1 ng/mL — ABNORMAL LOW (ref 1.0–8.0)
Myoglobin, poc: 129 ng/mL (ref 12–200)
Myoglobin, poc: 83.8 ng/mL (ref 12–200)
Troponin i, poc: <0.05 ng/mL (ref 0.00–0.09)
Troponin i, poc: <0.05 ng/mL (ref 0.00–0.09)

## 2010-08-17 LAB — CARDIAC PANEL(CRET KIN+CKTOT+MB+TROPI)
CK, MB: 1.8 ng/mL (ref 0.3–4.0)
CK, MB: 1.8 ng/mL (ref 0.3–4.0)
Relative Index: INVALID (ref 0.0–2.5)
Total CK: 40 U/L (ref 7–232)
Total CK: 50 U/L (ref 7–232)

## 2010-08-17 LAB — DIFFERENTIAL
Eosinophils Relative: 1 % (ref 0–5)
Lymphocytes Relative: 16 % (ref 12–46)
Monocytes Absolute: 0.8 10*3/uL (ref 0.1–1.0)
Monocytes Relative: 7 % (ref 3–12)
Neutro Abs: 8.5 10*3/uL — ABNORMAL HIGH (ref 1.7–7.7)

## 2010-08-17 LAB — LIPID PANEL
Cholesterol: 154 mg/dL (ref 0–200)
LDL Cholesterol: 110 mg/dL — ABNORMAL HIGH (ref 0–99)
VLDL: 26 mg/dL (ref 0–40)

## 2010-08-17 LAB — CBC
HCT: 41.3 % (ref 39.0–52.0)
Hemoglobin: 14.1 g/dL (ref 13.0–17.0)
MCHC: 34.1 g/dL (ref 30.0–36.0)
RBC: 4.33 MIL/uL (ref 4.22–5.81)
RDW: 13.7 % (ref 11.5–15.5)

## 2010-08-17 LAB — BASIC METABOLIC PANEL
BUN: 16 mg/dL (ref 6–23)
CO2: 32 mEq/L (ref 19–32)
Calcium: 8.3 mg/dL — ABNORMAL LOW (ref 8.4–10.5)
Creatinine, Ser: 1.46 mg/dL (ref 0.4–1.5)
GFR calc non Af Amer: 48 mL/min — ABNORMAL LOW (ref >60)
Glucose, Bld: 96 mg/dL (ref 70–99)

## 2010-08-17 LAB — BRAIN NATRIURETIC PEPTIDE: Pro B Natriuretic peptide (BNP): 415 pg/mL — ABNORMAL HIGH (ref 0.0–100.0)

## 2010-08-26 ENCOUNTER — Other Ambulatory Visit: Payer: Self-pay | Admitting: *Deleted

## 2010-08-26 MED ORDER — FUROSEMIDE 40 MG PO TABS: ORAL_TABLET | ORAL | Status: AC

## 2010-08-26 NOTE — Telephone Encounter (Signed)
Pt aware rx sent in. 

## 2010-09-01 ENCOUNTER — Emergency Department (HOSPITAL_COMMUNITY): Payer: Medicare Other

## 2010-09-01 ENCOUNTER — Inpatient Hospital Stay (HOSPITAL_COMMUNITY): Payer: Medicare Other

## 2010-09-01 DIAGNOSIS — Z8673 Personal history of transient ischemic attack (TIA), and cerebral infarction without residual deficits: Secondary | ICD-10-CM

## 2010-09-01 DIAGNOSIS — N183 Chronic kidney disease, stage 3 unspecified: Secondary | ICD-10-CM | POA: Diagnosis present

## 2010-09-01 DIAGNOSIS — E785 Hyperlipidemia, unspecified: Secondary | ICD-10-CM | POA: Diagnosis present

## 2010-09-01 DIAGNOSIS — E876 Hypokalemia: Secondary | ICD-10-CM | POA: Diagnosis present

## 2010-09-01 DIAGNOSIS — I498 Other specified cardiac arrhythmias: Secondary | ICD-10-CM | POA: Diagnosis present

## 2010-09-01 DIAGNOSIS — Z95810 Presence of automatic (implantable) cardiac defibrillator: Secondary | ICD-10-CM

## 2010-09-01 DIAGNOSIS — I469 Cardiac arrest, cause unspecified: Secondary | ICD-10-CM

## 2010-09-01 DIAGNOSIS — E872 Acidosis, unspecified: Secondary | ICD-10-CM | POA: Diagnosis not present

## 2010-09-01 DIAGNOSIS — E8779 Other fluid overload: Secondary | ICD-10-CM

## 2010-09-01 DIAGNOSIS — J9600 Acute respiratory failure, unspecified whether with hypoxia or hypercapnia: Secondary | ICD-10-CM

## 2010-09-01 DIAGNOSIS — I472 Ventricular tachycardia, unspecified: Secondary | ICD-10-CM | POA: Diagnosis not present

## 2010-09-01 DIAGNOSIS — J810 Acute pulmonary edema: Secondary | ICD-10-CM

## 2010-09-01 DIAGNOSIS — I509 Heart failure, unspecified: Secondary | ICD-10-CM | POA: Diagnosis present

## 2010-09-01 DIAGNOSIS — I4901 Ventricular fibrillation: Secondary | ICD-10-CM | POA: Diagnosis not present

## 2010-09-01 DIAGNOSIS — G931 Anoxic brain damage, not elsewhere classified: Secondary | ICD-10-CM | POA: Diagnosis not present

## 2010-09-01 DIAGNOSIS — Z515 Encounter for palliative care: Secondary | ICD-10-CM

## 2010-09-01 DIAGNOSIS — R570 Cardiogenic shock: Secondary | ICD-10-CM

## 2010-09-01 DIAGNOSIS — R0602 Shortness of breath: Secondary | ICD-10-CM

## 2010-09-01 DIAGNOSIS — Z9289 Personal history of other medical treatment: Secondary | ICD-10-CM

## 2010-09-01 DIAGNOSIS — I2510 Atherosclerotic heart disease of native coronary artery without angina pectoris: Secondary | ICD-10-CM | POA: Diagnosis present

## 2010-09-01 DIAGNOSIS — I2589 Other forms of chronic ischemic heart disease: Secondary | ICD-10-CM | POA: Diagnosis present

## 2010-09-01 DIAGNOSIS — R197 Diarrhea, unspecified: Secondary | ICD-10-CM | POA: Diagnosis not present

## 2010-09-01 DIAGNOSIS — N400 Enlarged prostate without lower urinary tract symptoms: Secondary | ICD-10-CM | POA: Diagnosis present

## 2010-09-01 DIAGNOSIS — Z951 Presence of aortocoronary bypass graft: Secondary | ICD-10-CM

## 2010-09-01 DIAGNOSIS — E669 Obesity, unspecified: Secondary | ICD-10-CM | POA: Diagnosis present

## 2010-09-01 DIAGNOSIS — I129 Hypertensive chronic kidney disease with stage 1 through stage 4 chronic kidney disease, or unspecified chronic kidney disease: Secondary | ICD-10-CM | POA: Diagnosis present

## 2010-09-01 DIAGNOSIS — I5023 Acute on chronic systolic (congestive) heart failure: Principal | ICD-10-CM | POA: Diagnosis present

## 2010-09-01 DIAGNOSIS — Z66 Do not resuscitate: Secondary | ICD-10-CM | POA: Diagnosis not present

## 2010-09-01 DIAGNOSIS — E870 Hyperosmolality and hypernatremia: Secondary | ICD-10-CM | POA: Diagnosis not present

## 2010-09-01 DIAGNOSIS — Z7902 Long term (current) use of antithrombotics/antiplatelets: Secondary | ICD-10-CM

## 2010-09-01 DIAGNOSIS — I214 Non-ST elevation (NSTEMI) myocardial infarction: Secondary | ICD-10-CM | POA: Diagnosis not present

## 2010-09-01 DIAGNOSIS — J189 Pneumonia, unspecified organism: Secondary | ICD-10-CM | POA: Diagnosis not present

## 2010-09-01 DIAGNOSIS — I4729 Other ventricular tachycardia: Secondary | ICD-10-CM | POA: Diagnosis not present

## 2010-09-01 DIAGNOSIS — Z85820 Personal history of malignant melanoma of skin: Secondary | ICD-10-CM

## 2010-09-01 DIAGNOSIS — K219 Gastro-esophageal reflux disease without esophagitis: Secondary | ICD-10-CM | POA: Diagnosis present

## 2010-09-01 DIAGNOSIS — M109 Gout, unspecified: Secondary | ICD-10-CM | POA: Diagnosis present

## 2010-09-01 LAB — BASIC METABOLIC PANEL
BUN: 24 mg/dL — ABNORMAL HIGH (ref 6–23)
Calcium: 7.9 mg/dL — ABNORMAL LOW (ref 8.4–10.5)
Creatinine, Ser: 1.75 mg/dL — ABNORMAL HIGH (ref 0.4–1.5)
GFR calc non Af Amer: 39 mL/min — ABNORMAL LOW (ref >60)
GFR calc non Af Amer: 41 mL/min — ABNORMAL LOW (ref >60)
Glucose, Bld: 184 mg/dL — ABNORMAL HIGH (ref 70–99)
Glucose, Bld: 223 mg/dL — ABNORMAL HIGH (ref 70–99)
Potassium: 3.1 mEq/L — ABNORMAL LOW (ref 3.5–5.1)
Sodium: 144 mEq/L (ref 135–145)

## 2010-09-01 LAB — DIFFERENTIAL
Basophils Absolute: 0 10*3/uL (ref 0.0–0.1)
Basophils Relative: 0 % (ref 0–1)
Eosinophils Absolute: 0 10*3/uL (ref 0.0–0.7)
Eosinophils Relative: 1 % (ref 0–5)
Lymphocytes Relative: 15 % (ref 12–46)
Lymphs Abs: 1.6 10*3/uL (ref 0.7–4.0)
Lymphs Abs: 1.3 10*3/uL (ref 0.7–4.0)
Neutro Abs: 6.3 10*3/uL (ref 1.7–7.7)
Neutrophils Relative %: 80 % — ABNORMAL HIGH (ref 43–77)
Neutrophils Relative %: 74 % (ref 43–77)

## 2010-09-01 LAB — COMPREHENSIVE METABOLIC PANEL
AST: 14 U/L (ref 0–37)
Albumin: 3.8 g/dL (ref 3.5–5.2)
Alkaline Phosphatase: 65 U/L (ref 39–117)
Alkaline Phosphatase: 56 U/L (ref 39–117)
BUN: 24 mg/dL — ABNORMAL HIGH (ref 6–23)
Calcium: 8 mg/dL — ABNORMAL LOW (ref 8.4–10.5)
Chloride: 104 mEq/L (ref 96–112)
GFR calc Af Amer: 45 mL/min — ABNORMAL LOW (ref >60)
Glucose, Bld: 191 mg/dL — ABNORMAL HIGH (ref 70–99)
Potassium: 3.1 mEq/L — ABNORMAL LOW (ref 3.5–5.1)
Total Bilirubin: 1.2 mg/dL (ref 0.3–1.2)
Total Protein: 6.7 g/dL (ref 6.0–8.3)

## 2010-09-01 LAB — URINALYSIS, ROUTINE W REFLEX MICROSCOPIC
Bilirubin Urine: NEGATIVE
Leukocytes, UA: NEGATIVE
Nitrite: NEGATIVE
Specific Gravity, Urine: 1.013 (ref 1.005–1.030)
Urobilinogen, UA: 1 mg/dL (ref 0.0–1.0)

## 2010-09-01 LAB — CARDIAC PANEL(CRET KIN+CKTOT+MB+TROPI)
Relative Index: INVALID (ref 0.0–2.5)
Troponin I: 0.07 ng/mL — ABNORMAL HIGH (ref 0.00–0.06)

## 2010-09-01 LAB — CBC
HCT: 37.6 % — ABNORMAL LOW (ref 39.0–52.0)
Hemoglobin: 12.1 g/dL — ABNORMAL LOW (ref 13.0–17.0)
MCHC: 32.2 g/dL (ref 30.0–36.0)
MCV: 96.2 fL (ref 78.0–100.0)
MCV: 96.9 fL (ref 78.0–100.0)
Platelets: 118 10*3/uL — ABNORMAL LOW (ref 150–400)
Platelets: 113 10*3/uL — ABNORMAL LOW (ref 150–400)
RBC: 3.94 MIL/uL — ABNORMAL LOW (ref 4.22–5.81)
RBC: 3.88 MIL/uL — ABNORMAL LOW (ref 4.22–5.81)
RDW: 14 % (ref 11.5–15.5)
WBC: 10.8 10*3/uL — ABNORMAL HIGH (ref 4.0–10.5)
WBC: 8.5 10*3/uL (ref 4.0–10.5)

## 2010-09-01 LAB — POCT I-STAT 3, ART BLOOD GAS (G3+)
Acid-Base Excess: 7 mmol/L — ABNORMAL HIGH (ref 0.0–2.0)
Bicarbonate: 30.5 mEq/L — ABNORMAL HIGH (ref 20.0–24.0)
O2 Saturation: 93 %
TCO2: 32 mmol/L (ref 0–100)
pCO2 arterial: 49.6 mmHg — ABNORMAL HIGH (ref 35.0–45.0)
pH, Arterial: 7.393 (ref 7.350–7.450)

## 2010-09-01 LAB — GLUCOSE, CAPILLARY: Glucose-Capillary: 218 mg/dL — ABNORMAL HIGH (ref 70–99)

## 2010-09-01 LAB — URINE MICROSCOPIC-ADD ON

## 2010-09-01 LAB — PROTIME-INR
INR: 1.2 (ref 0.00–1.49)
Prothrombin Time: 15.4 seconds — ABNORMAL HIGH (ref 11.6–15.2)

## 2010-09-01 LAB — GLUCOSE, RANDOM: Glucose, Bld: 239 mg/dL — ABNORMAL HIGH (ref 70–99)

## 2010-09-01 LAB — POCT CARDIAC MARKERS: Troponin i, poc: <0.05 ng/mL (ref 0.00–0.09)

## 2010-09-01 LAB — MAGNESIUM: Magnesium: 2.2 mg/dL (ref 1.5–2.5)

## 2010-09-01 LAB — RAPID URINE DRUG SCREEN, HOSP PERFORMED
Benzodiazepines: NOT DETECTED
Tetrahydrocannabinol: NOT DETECTED

## 2010-09-01 LAB — D-DIMER, QUANTITATIVE: D-Dimer, Quant: 0.76 ug/mL-FEU — ABNORMAL HIGH (ref 0.00–0.48)

## 2010-09-02 ENCOUNTER — Inpatient Hospital Stay (HOSPITAL_COMMUNITY): Payer: Medicare Other

## 2010-09-02 DIAGNOSIS — I059 Rheumatic mitral valve disease, unspecified: Secondary | ICD-10-CM

## 2010-09-02 DIAGNOSIS — I214 Non-ST elevation (NSTEMI) myocardial infarction: Secondary | ICD-10-CM

## 2010-09-02 LAB — BASIC METABOLIC PANEL
BUN: 21 mg/dL (ref 6–23)
BUN: 23 mg/dL (ref 6–23)
BUN: 23 mg/dL (ref 6–23)
BUN: 23 mg/dL (ref 6–23)
BUN: 26 mg/dL — ABNORMAL HIGH (ref 6–23)
CO2: 24 mEq/L (ref 19–32)
CO2: 25 mEq/L (ref 19–32)
CO2: 25 mEq/L (ref 19–32)
Chloride: 109 mEq/L (ref 96–112)
Chloride: 110 mEq/L (ref 96–112)
Chloride: 113 mEq/L — ABNORMAL HIGH (ref 96–112)
Chloride: 114 mEq/L — ABNORMAL HIGH (ref 96–112)
Chloride: 115 mEq/L — ABNORMAL HIGH (ref 96–112)
Creatinine, Ser: 1.5 mg/dL (ref 0.4–1.5)
Creatinine, Ser: 1.5 mg/dL (ref 0.4–1.5)
Creatinine, Ser: 1.58 mg/dL — ABNORMAL HIGH (ref 0.4–1.5)
GFR calc non Af Amer: 45 mL/min — ABNORMAL LOW (ref >60)
Glucose, Bld: 102 mg/dL — ABNORMAL HIGH (ref 70–99)
Glucose, Bld: 103 mg/dL — ABNORMAL HIGH (ref 70–99)
Glucose, Bld: 106 mg/dL — ABNORMAL HIGH (ref 70–99)
Glucose, Bld: 193 mg/dL — ABNORMAL HIGH (ref 70–99)
Potassium: 3.4 mEq/L — ABNORMAL LOW (ref 3.5–5.1)
Potassium: 3.4 mEq/L — ABNORMAL LOW (ref 3.5–5.1)
Potassium: 4.5 mEq/L (ref 3.5–5.1)
Potassium: 4 mEq/L (ref 3.5–5.1)
Sodium: 146 mEq/L — ABNORMAL HIGH (ref 135–145)
Sodium: 144 mEq/L (ref 135–145)

## 2010-09-02 LAB — POCT I-STAT 3, ART BLOOD GAS (G3+)
Acid-Base Excess: 2 mmol/L (ref 0.0–2.0)
Bicarbonate: 26.6 mEq/L — ABNORMAL HIGH (ref 20.0–24.0)
O2 Saturation: 99 %
Patient temperature: 33.1
Patient temperature: 90.1
TCO2: 28 mmol/L (ref 0–100)
TCO2: 28 mmol/L (ref 0–100)
pH, Arterial: 7.517 — ABNORMAL HIGH (ref 7.350–7.450)
pH, Arterial: 7.473 — ABNORMAL HIGH (ref 7.350–7.450)

## 2010-09-02 LAB — COMPREHENSIVE METABOLIC PANEL
ALT: 23 U/L (ref 0–53)
AST: 83 U/L — ABNORMAL HIGH (ref 0–37)
Albumin: 3.1 g/dL — ABNORMAL LOW (ref 3.5–5.2)
Calcium: 8.3 mg/dL — ABNORMAL LOW (ref 8.4–10.5)
Creatinine, Ser: 1.53 mg/dL — ABNORMAL HIGH (ref 0.4–1.5)
GFR calc Af Amer: 55 mL/min — ABNORMAL LOW (ref >60)
Sodium: 146 mEq/L — ABNORMAL HIGH (ref 135–145)
Total Protein: 6.1 g/dL (ref 6.0–8.3)

## 2010-09-02 LAB — CARDIAC PANEL(CRET KIN+CKTOT+MB+TROPI)
CK, MB: 184.5 ng/mL (ref 0.3–4.0)
Relative Index: 40 — ABNORMAL HIGH (ref 0.0–2.5)
Total CK: 557 U/L — ABNORMAL HIGH (ref 7–232)
Troponin I: 9.42 ng/mL (ref 0.00–0.06)
Troponin I: 8.55 ng/mL (ref 0.00–0.06)

## 2010-09-02 LAB — CBC
Hemoglobin: 12.1 g/dL — ABNORMAL LOW (ref 13.0–17.0)
MCHC: 32.5 g/dL (ref 30.0–36.0)
Platelets: 103 10*3/uL — ABNORMAL LOW (ref 150–400)
RDW: 13.8 % (ref 11.5–15.5)

## 2010-09-02 LAB — GLUCOSE, CAPILLARY
Glucose-Capillary: 109 mg/dL — ABNORMAL HIGH (ref 70–99)
Glucose-Capillary: 112 mg/dL — ABNORMAL HIGH (ref 70–99)
Glucose-Capillary: 127 mg/dL — ABNORMAL HIGH (ref 70–99)
Glucose-Capillary: 182 mg/dL — ABNORMAL HIGH (ref 70–99)
Glucose-Capillary: 197 mg/dL — ABNORMAL HIGH (ref 70–99)
Glucose-Capillary: 197 mg/dL — ABNORMAL HIGH (ref 70–99)
Glucose-Capillary: 89 mg/dL (ref 70–99)
Glucose-Capillary: 96 mg/dL (ref 70–99)
Glucose-Capillary: 91 mg/dL (ref 70–99)

## 2010-09-02 LAB — PROTIME-INR: Prothrombin Time: 15.5 seconds — ABNORMAL HIGH (ref 11.6–15.2)

## 2010-09-02 LAB — APTT: aPTT: 31 seconds (ref 24–37)

## 2010-09-03 ENCOUNTER — Inpatient Hospital Stay (HOSPITAL_COMMUNITY): Payer: Medicare Other

## 2010-09-03 DIAGNOSIS — I4901 Ventricular fibrillation: Secondary | ICD-10-CM

## 2010-09-03 LAB — BASIC METABOLIC PANEL
BUN: 23 mg/dL (ref 6–23)
CO2: 24 mEq/L (ref 19–32)
CO2: 25 mEq/L (ref 19–32)
CO2: 26 mEq/L (ref 19–32)
Calcium: 7.8 mg/dL — ABNORMAL LOW (ref 8.4–10.5)
Calcium: 8 mg/dL — ABNORMAL LOW (ref 8.4–10.5)
Chloride: 111 mEq/L (ref 96–112)
Chloride: 113 mEq/L — ABNORMAL HIGH (ref 96–112)
Chloride: 115 mEq/L — ABNORMAL HIGH (ref 96–112)
Creatinine, Ser: 1.53 mg/dL — ABNORMAL HIGH (ref 0.4–1.5)
GFR calc Af Amer: 54 mL/min — ABNORMAL LOW (ref >60)
GFR calc Af Amer: 55 mL/min — ABNORMAL LOW (ref >60)
GFR calc non Af Amer: 44 mL/min — ABNORMAL LOW (ref >60)
GFR calc non Af Amer: 30 mL/min — ABNORMAL LOW (ref >60)
Glucose, Bld: 102 mg/dL — ABNORMAL HIGH (ref 70–99)
Glucose, Bld: 106 mg/dL — ABNORMAL HIGH (ref 70–99)
Glucose, Bld: 110 mg/dL — ABNORMAL HIGH (ref 70–99)
Potassium: 4.2 mEq/L (ref 3.5–5.1)
Potassium: 4.6 mEq/L (ref 3.5–5.1)
Potassium: 4.6 mEq/L (ref 3.5–5.1)
Sodium: 144 mEq/L (ref 135–145)
Sodium: 144 mEq/L (ref 135–145)
Sodium: 145 mEq/L (ref 135–145)

## 2010-09-03 LAB — POCT I-STAT 3, ART BLOOD GAS (G3+)
Bicarbonate: 26.9 mEq/L — ABNORMAL HIGH (ref 20.0–24.0)
Patient temperature: 95.3
TCO2: 29 mmol/L (ref 0–100)
pO2, Arterial: 79 mmHg — ABNORMAL LOW (ref 80.0–100.0)

## 2010-09-03 LAB — CBC
MCHC: 32.2 g/dL (ref 30.0–36.0)
RDW: 14.3 % (ref 11.5–15.5)
WBC: 13.4 10*3/uL — ABNORMAL HIGH (ref 4.0–10.5)

## 2010-09-03 LAB — CARDIAC PANEL(CRET KIN+CKTOT+MB+TROPI)
CK, MB: 125.1 ng/mL (ref 0.3–4.0)
Total CK: 232 U/L (ref 7–232)

## 2010-09-03 LAB — GLUCOSE, CAPILLARY
Glucose-Capillary: 102 mg/dL — ABNORMAL HIGH (ref 70–99)
Glucose-Capillary: 123 mg/dL — ABNORMAL HIGH (ref 70–99)
Glucose-Capillary: 94 mg/dL (ref 70–99)
Glucose-Capillary: 99 mg/dL (ref 70–99)

## 2010-09-03 LAB — PROCALCITONIN: Procalcitonin: 0.61 ng/mL

## 2010-09-04 ENCOUNTER — Inpatient Hospital Stay (HOSPITAL_COMMUNITY): Payer: Medicare Other

## 2010-09-04 DIAGNOSIS — I472 Ventricular tachycardia: Secondary | ICD-10-CM

## 2010-09-04 LAB — POCT I-STAT 3, ART BLOOD GAS (G3+)
O2 Saturation: 97 %
TCO2: 24 mmol/L (ref 0–100)
pCO2 arterial: 47.2 mmHg — ABNORMAL HIGH (ref 35.0–45.0)
pH, Arterial: 7.298 — ABNORMAL LOW (ref 7.350–7.450)
pO2, Arterial: 99 mmHg (ref 80.0–100.0)

## 2010-09-04 LAB — BASIC METABOLIC PANEL
BUN: 30 mg/dL — ABNORMAL HIGH (ref 6–23)
Chloride: 112 mEq/L (ref 96–112)
GFR calc non Af Amer: 26 mL/min — ABNORMAL LOW (ref >60)
Glucose, Bld: 87 mg/dL (ref 70–99)
Potassium: 4.2 mEq/L (ref 3.5–5.1)
Sodium: 145 mEq/L (ref 135–145)

## 2010-09-04 LAB — CBC
HCT: 36.4 % — ABNORMAL LOW (ref 39.0–52.0)
Hemoglobin: 11.1 g/dL — ABNORMAL LOW (ref 13.0–17.0)
MCH: 30.7 pg (ref 26.0–34.0)
MCHC: 30.5 g/dL (ref 30.0–36.0)
MCV: 100.6 fL — ABNORMAL HIGH (ref 78.0–100.0)
RBC: 3.62 MIL/uL — ABNORMAL LOW (ref 4.22–5.81)

## 2010-09-04 LAB — GLUCOSE, CAPILLARY
Glucose-Capillary: 102 mg/dL — ABNORMAL HIGH (ref 70–99)
Glucose-Capillary: 92 mg/dL (ref 70–99)
Glucose-Capillary: 75 mg/dL (ref 70–99)
Glucose-Capillary: 118 mg/dL — ABNORMAL HIGH (ref 70–99)

## 2010-09-04 LAB — BLOOD GAS, ARTERIAL
Acid-base deficit: 1.2 mmol/L (ref 0.0–2.0)
Bicarbonate: 24.8 mEq/L — ABNORMAL HIGH (ref 20.0–24.0)
FIO2: 0.4 %
O2 Saturation: 98.1 %
PEEP: 5 cmH2O
Patient temperature: 99.3
TCO2: 26.5 mmol/L (ref 0–100)
pO2, Arterial: 109 mmHg — ABNORMAL HIGH (ref 80.0–100.0)

## 2010-09-04 LAB — CULTURE, RESPIRATORY W GRAM STAIN

## 2010-09-05 LAB — CARBOXYHEMOGLOBIN
Methemoglobin: 0.5 % (ref 0.0–1.5)
Total hemoglobin: 10.9 g/dL — ABNORMAL LOW (ref 13.5–18.0)

## 2010-09-05 LAB — BLOOD GAS, ARTERIAL
MECHVT: 500 mL
PEEP: 5 cmH2O
Patient temperature: 99.3
pCO2 arterial: 44.4 mmHg (ref 35.0–45.0)
pH, Arterial: 7.364 (ref 7.350–7.450)

## 2010-09-05 LAB — GLUCOSE, CAPILLARY
Glucose-Capillary: 102 mg/dL — ABNORMAL HIGH (ref 70–99)
Glucose-Capillary: 103 mg/dL — ABNORMAL HIGH (ref 70–99)
Glucose-Capillary: 114 mg/dL — ABNORMAL HIGH (ref 70–99)
Glucose-Capillary: 99 mg/dL (ref 70–99)

## 2010-09-05 LAB — BASIC METABOLIC PANEL
Calcium: 8.2 mg/dL — ABNORMAL LOW (ref 8.4–10.5)
Creatinine, Ser: 2.45 mg/dL — ABNORMAL HIGH (ref 0.4–1.5)
GFR calc Af Amer: 32 mL/min — ABNORMAL LOW (ref >60)
GFR calc non Af Amer: 26 mL/min — ABNORMAL LOW (ref >60)
Sodium: 147 mEq/L — ABNORMAL HIGH (ref 135–145)

## 2010-09-06 ENCOUNTER — Inpatient Hospital Stay (HOSPITAL_COMMUNITY): Payer: Medicare Other

## 2010-09-06 DIAGNOSIS — R570 Cardiogenic shock: Secondary | ICD-10-CM

## 2010-09-06 DIAGNOSIS — J9600 Acute respiratory failure, unspecified whether with hypoxia or hypercapnia: Secondary | ICD-10-CM

## 2010-09-06 DIAGNOSIS — J810 Acute pulmonary edema: Secondary | ICD-10-CM

## 2010-09-06 DIAGNOSIS — I469 Cardiac arrest, cause unspecified: Secondary | ICD-10-CM

## 2010-09-06 LAB — CARBOXYHEMOGLOBIN
Carboxyhemoglobin: 1.6 % — ABNORMAL HIGH (ref 0.5–1.5)
Methemoglobin: 0.8 % (ref 0.0–1.5)
O2 Saturation: 79.9 %

## 2010-09-06 LAB — BASIC METABOLIC PANEL
BUN: 32 mg/dL — ABNORMAL HIGH (ref 6–23)
CO2: 28 mEq/L (ref 19–32)
Chloride: 113 mEq/L — ABNORMAL HIGH (ref 96–112)
Creatinine, Ser: 2.25 mg/dL — ABNORMAL HIGH (ref 0.4–1.5)
Glucose, Bld: 118 mg/dL — ABNORMAL HIGH (ref 70–99)
Potassium: 3.4 mEq/L — ABNORMAL LOW (ref 3.5–5.1)

## 2010-09-06 LAB — CBC
HCT: 33.3 % — ABNORMAL LOW (ref 39.0–52.0)
Hemoglobin: 10.5 g/dL — ABNORMAL LOW (ref 13.0–17.0)
MCH: 31.2 pg (ref 26.0–34.0)
MCV: 98.8 fL (ref 78.0–100.0)
Platelets: 108 10*3/uL — ABNORMAL LOW (ref 150–400)
RBC: 3.37 MIL/uL — ABNORMAL LOW (ref 4.22–5.81)
WBC: 9.2 10*3/uL (ref 4.0–10.5)

## 2010-09-06 LAB — POCT I-STAT 3, ART BLOOD GAS (G3+)
Acid-Base Excess: 1 mmol/L (ref 0.0–2.0)
O2 Saturation: 95 %
Patient temperature: 98.7
TCO2: 28 mmol/L (ref 0–100)
pCO2 arterial: 45.6 mmHg — ABNORMAL HIGH (ref 35.0–45.0)

## 2010-09-06 LAB — GLUCOSE, CAPILLARY: Glucose-Capillary: 113 mg/dL — ABNORMAL HIGH (ref 70–99)

## 2010-09-06 LAB — MAGNESIUM: Magnesium: 2.1 mg/dL (ref 1.5–2.5)

## 2010-09-07 ENCOUNTER — Inpatient Hospital Stay (HOSPITAL_COMMUNITY): Payer: Medicare Other

## 2010-09-07 LAB — GLUCOSE, CAPILLARY
Glucose-Capillary: 126 mg/dL — ABNORMAL HIGH (ref 70–99)
Glucose-Capillary: 131 mg/dL — ABNORMAL HIGH (ref 70–99)
Glucose-Capillary: 117 mg/dL — ABNORMAL HIGH (ref 70–99)

## 2010-09-07 LAB — MAGNESIUM: Magnesium: 2.1 mg/dL (ref 1.5–2.5)

## 2010-09-07 LAB — BASIC METABOLIC PANEL
BUN: 34 mg/dL — ABNORMAL HIGH (ref 6–23)
CO2: 28 mEq/L (ref 19–32)
Chloride: 114 mEq/L — ABNORMAL HIGH (ref 96–112)
GFR calc Af Amer: 35 mL/min — ABNORMAL LOW (ref >60)
Potassium: 3.6 mEq/L (ref 3.5–5.1)

## 2010-09-07 LAB — CBC
Hemoglobin: 10.9 g/dL — ABNORMAL LOW (ref 13.0–17.0)
MCV: 99.1 fL (ref 78.0–100.0)
Platelets: 107 10*3/uL — ABNORMAL LOW (ref 150–400)
RBC: 3.5 MIL/uL — ABNORMAL LOW (ref 4.22–5.81)
WBC: 8.4 10*3/uL (ref 4.0–10.5)

## 2010-09-07 LAB — PHOSPHORUS: Phosphorus: 3.9 mg/dL (ref 2.3–4.6)

## 2010-09-08 DIAGNOSIS — J810 Acute pulmonary edema: Secondary | ICD-10-CM

## 2010-09-08 DIAGNOSIS — J9600 Acute respiratory failure, unspecified whether with hypoxia or hypercapnia: Secondary | ICD-10-CM

## 2010-09-08 LAB — BASIC METABOLIC PANEL
BUN: 30 mg/dL — ABNORMAL HIGH (ref 6–23)
CO2: 26 mEq/L (ref 19–32)
Calcium: 8.2 mg/dL — ABNORMAL LOW (ref 8.4–10.5)
Glucose, Bld: 116 mg/dL — ABNORMAL HIGH (ref 70–99)
Sodium: 152 mEq/L — ABNORMAL HIGH (ref 135–145)

## 2010-09-08 LAB — GLUCOSE, CAPILLARY: Glucose-Capillary: 135 mg/dL — ABNORMAL HIGH (ref 70–99)

## 2010-09-08 DEATH — deceased

## 2010-09-09 ENCOUNTER — Inpatient Hospital Stay (HOSPITAL_COMMUNITY): Payer: Medicare Other

## 2010-09-09 DIAGNOSIS — E871 Hypo-osmolality and hyponatremia: Secondary | ICD-10-CM

## 2010-09-09 DIAGNOSIS — I5023 Acute on chronic systolic (congestive) heart failure: Secondary | ICD-10-CM

## 2010-09-09 DIAGNOSIS — I469 Cardiac arrest, cause unspecified: Secondary | ICD-10-CM

## 2010-09-09 LAB — BASIC METABOLIC PANEL
Calcium: 8.8 mg/dL (ref 8.4–10.5)
Chloride: 118 mEq/L — ABNORMAL HIGH (ref 96–112)
Creatinine, Ser: 2.48 mg/dL — ABNORMAL HIGH (ref 0.4–1.5)
GFR calc Af Amer: 31 mL/min — ABNORMAL LOW (ref >60)
GFR calc Af Amer: 31 mL/min — ABNORMAL LOW (ref >60)
GFR calc non Af Amer: 26 mL/min — ABNORMAL LOW (ref >60)
Potassium: 3.5 mEq/L (ref 3.5–5.1)
Sodium: 158 mEq/L — ABNORMAL HIGH (ref 135–145)
Sodium: 153 mEq/L — ABNORMAL HIGH (ref 135–145)

## 2010-09-09 LAB — GLUCOSE, CAPILLARY
Glucose-Capillary: 104 mg/dL — ABNORMAL HIGH (ref 70–99)
Glucose-Capillary: 124 mg/dL — ABNORMAL HIGH (ref 70–99)

## 2010-09-09 LAB — POCT I-STAT 3, ART BLOOD GAS (G3+)
pCO2 arterial: 58.5 mmHg (ref 35.0–45.0)
pO2, Arterial: 86 mmHg (ref 80.0–100.0)

## 2010-09-10 ENCOUNTER — Inpatient Hospital Stay (HOSPITAL_COMMUNITY): Payer: Medicare Other

## 2010-09-10 DIAGNOSIS — I469 Cardiac arrest, cause unspecified: Secondary | ICD-10-CM

## 2010-09-10 DIAGNOSIS — R570 Cardiogenic shock: Secondary | ICD-10-CM

## 2010-09-10 LAB — CARDIAC PANEL(CRET KIN+CKTOT+MB+TROPI)
CK, MB: 2.7 ng/mL (ref 0.3–4.0)
CK, MB: 2.6 ng/mL (ref 0.3–4.0)
Relative Index: INVALID (ref 0.0–2.5)
Relative Index: INVALID (ref 0.0–2.5)
Total CK: 33 U/L (ref 7–232)
Troponin I: <0.3 ng/mL (ref <0.30)
Troponin I: <0.3 ng/mL (ref <0.30)

## 2010-09-10 LAB — BASIC METABOLIC PANEL
CO2: 30 mEq/L (ref 19–32)
Calcium: 8.1 mg/dL — ABNORMAL LOW (ref 8.4–10.5)
Chloride: 115 mEq/L — ABNORMAL HIGH (ref 96–112)
Chloride: 114 mEq/L — ABNORMAL HIGH (ref 96–112)
Creatinine, Ser: 2.4 mg/dL — ABNORMAL HIGH (ref 0.4–1.5)
Creatinine, Ser: 2.31 mg/dL — ABNORMAL HIGH (ref 0.4–1.5)
GFR calc Af Amer: 33 mL/min — ABNORMAL LOW (ref >60)
GFR calc Af Amer: 34 mL/min — ABNORMAL LOW (ref >60)
Glucose, Bld: 192 mg/dL — ABNORMAL HIGH (ref 70–99)
Potassium: 3.3 mEq/L — ABNORMAL LOW (ref 3.5–5.1)
Sodium: 154 mEq/L — ABNORMAL HIGH (ref 135–145)
Sodium: 150 mEq/L — ABNORMAL HIGH (ref 135–145)

## 2010-09-10 LAB — PROCALCITONIN: Procalcitonin: 0.24 ng/mL

## 2010-09-10 LAB — BLOOD GAS, ARTERIAL
Drawn by: 31996
MECHVT: 550 mL
O2 Saturation: 100 %
PEEP: 5 cmH2O
RATE: 16 resp/min
pCO2 arterial: 50.9 mmHg — ABNORMAL HIGH (ref 35.0–45.0)
pO2, Arterial: 287 mmHg — ABNORMAL HIGH (ref 80.0–100.0)

## 2010-09-10 LAB — GLUCOSE, CAPILLARY
Glucose-Capillary: 157 mg/dL — ABNORMAL HIGH (ref 70–99)
Glucose-Capillary: 164 mg/dL — ABNORMAL HIGH (ref 70–99)
Glucose-Capillary: 156 mg/dL — ABNORMAL HIGH (ref 70–99)
Glucose-Capillary: 142 mg/dL — ABNORMAL HIGH (ref 70–99)
Glucose-Capillary: 156 mg/dL — ABNORMAL HIGH (ref 70–99)

## 2010-09-10 LAB — COMPREHENSIVE METABOLIC PANEL
BUN: 35 mg/dL — ABNORMAL HIGH (ref 6–23)
Calcium: 8 mg/dL — ABNORMAL LOW (ref 8.4–10.5)
Glucose, Bld: 192 mg/dL — ABNORMAL HIGH (ref 70–99)
Total Protein: 5.7 g/dL — ABNORMAL LOW (ref 6.0–8.3)

## 2010-09-10 LAB — POCT I-STAT 3, ART BLOOD GAS (G3+)
Bicarbonate: 27.5 mEq/L — ABNORMAL HIGH (ref 20.0–24.0)
Patient temperature: 98.7
pH, Arterial: 7.163 — CL (ref 7.350–7.450)
pO2, Arterial: 80 mmHg (ref 80.0–100.0)

## 2010-09-10 LAB — PROTIME-INR: INR: 1.28 (ref 0.00–1.49)

## 2010-09-10 LAB — AMMONIA: Ammonia: 44 umol/L (ref 11–60)

## 2010-09-10 LAB — CBC
MCHC: 30.2 g/dL (ref 30.0–36.0)
RDW: 14.6 % (ref 11.5–15.5)
WBC: 10.3 10*3/uL (ref 4.0–10.5)

## 2010-09-10 LAB — APTT: aPTT: 30 seconds (ref 24–37)

## 2010-09-11 ENCOUNTER — Inpatient Hospital Stay (HOSPITAL_COMMUNITY): Payer: Medicare Other

## 2010-09-11 DIAGNOSIS — R069 Unspecified abnormalities of breathing: Secondary | ICD-10-CM

## 2010-09-11 LAB — BASIC METABOLIC PANEL
Chloride: 115 mEq/L — ABNORMAL HIGH (ref 96–112)
GFR calc Af Amer: 30 mL/min — ABNORMAL LOW (ref >60)
GFR calc non Af Amer: 25 mL/min — ABNORMAL LOW (ref >60)
Potassium: 3.3 mEq/L — ABNORMAL LOW (ref 3.5–5.1)

## 2010-09-11 LAB — CBC
HCT: 33 % — ABNORMAL LOW (ref 39.0–52.0)
Hemoglobin: 10 g/dL — ABNORMAL LOW (ref 13.0–17.0)
MCH: 30.1 pg (ref 26.0–34.0)
MCHC: 30.3 g/dL (ref 30.0–36.0)
MCV: 99.4 fL (ref 78.0–100.0)
RBC: 3.32 MIL/uL — ABNORMAL LOW (ref 4.22–5.81)

## 2010-09-11 LAB — GLUCOSE, CAPILLARY
Glucose-Capillary: 105 mg/dL — ABNORMAL HIGH (ref 70–99)
Glucose-Capillary: 137 mg/dL — ABNORMAL HIGH (ref 70–99)

## 2010-09-11 LAB — BLOOD GAS, ARTERIAL
Acid-Base Excess: 3.7 mmol/L — ABNORMAL HIGH (ref 0.0–2.0)
Bicarbonate: 28.1 mEq/L — ABNORMAL HIGH (ref 20.0–24.0)
FIO2: 0.4 %
O2 Saturation: 95.8 %
PEEP: 5 cmH2O
Patient temperature: 98.6
TCO2: 29.5 mmol/L (ref 0–100)
pO2, Arterial: 73.5 mmHg — ABNORMAL LOW (ref 80.0–100.0)

## 2010-09-11 LAB — CARDIAC PANEL(CRET KIN+CKTOT+MB+TROPI)
CK, MB: 2.2 ng/mL (ref 0.3–4.0)
Total CK: 49 U/L (ref 7–232)

## 2010-09-11 NOTE — Procedures (Unsigned)
HISTORY:  The patient is a 70 year old man admitted after having cardiac arrest and respiratory failure.  Now he has been lethargic and sluggish to response with right-sided weakness.  There is history of brain tumor, status post resection remotely.  MEDICATIONS:  Cordarone, Zyloprim, Coreg, Plavix, Lasix, Neurontin, Robitussin, NovoLog, Imdur, Protonix, K-Dur, and Apresoline.  EEG DURATION:  21.5 minutes of EEG recording.  EEG DESCRIPTION:  This is a routine 18-channel adult EEG recording with one channel devoted to limited EKG recording.  Activation procedure was performed during the photic stimulation and the study does not suggest any change in sleep versus awake state.  As the EEG opens up, I do not notice any clear location for a posterior dominant rhythm.  However, the background rhythm consists of high delta to mid theta ranges 6 Hz at the most and is reactive.  There is significant motion artifact as well as superimposed beta activity.  However, there is no evidence to suggest electrographic seizure or epileptiform discharges.  No driving was noted with the photic stimulation in posterior leads either.  EEG INTERPRETATION:  This is an abnormal EEG due to the generalized reactive slowing in mid theta ranges.  There is no evidence to suggest electrographic seizure or epileptiform discharges on this EEG.  CLINICAL NOTE:  This degree of reactive slowing signifies a nonspecific but moderate degree of encephalopathy, which can be seen in a variety of toxic, metabolic, ischemic, infectious, or traumatic causes.  Clinical correlation is advised.          ______________________________ Levie Heritage, MD    EA:VWUJ D:  09/10/2010 16:02:49  T:  09/11/2010 03:17:00  Job #:  [id number]

## 2010-09-12 ENCOUNTER — Inpatient Hospital Stay (HOSPITAL_COMMUNITY): Payer: Medicare Other

## 2010-09-12 LAB — GLUCOSE, CAPILLARY
Glucose-Capillary: 117 mg/dL — ABNORMAL HIGH (ref 70–99)
Glucose-Capillary: 124 mg/dL — ABNORMAL HIGH (ref 70–99)
Glucose-Capillary: 128 mg/dL — ABNORMAL HIGH (ref 70–99)
Glucose-Capillary: 129 mg/dL — ABNORMAL HIGH (ref 70–99)
Glucose-Capillary: 135 mg/dL — ABNORMAL HIGH (ref 70–99)

## 2010-09-12 LAB — CBC
Hemoglobin: 10 g/dL — ABNORMAL LOW (ref 13.0–17.0)
MCH: 29.8 pg (ref 26.0–34.0)
MCHC: 30.2 g/dL (ref 30.0–36.0)
MCV: 98.5 fL (ref 78.0–100.0)
Platelets: 104 10*3/uL — ABNORMAL LOW (ref 150–400)
RBC: 3.36 MIL/uL — ABNORMAL LOW (ref 4.22–5.81)

## 2010-09-12 LAB — BLOOD GAS, ARTERIAL
Drawn by: 31777
PEEP: 5 cmH2O
Pressure support: 5 cmH2O
pCO2 arterial: 59 mmHg (ref 35.0–45.0)
pH, Arterial: 7.287 — ABNORMAL LOW (ref 7.350–7.450)

## 2010-09-12 LAB — BASIC METABOLIC PANEL
BUN: 30 mg/dL — ABNORMAL HIGH (ref 6–23)
Calcium: 7.8 mg/dL — ABNORMAL LOW (ref 8.4–10.5)
GFR calc non Af Amer: 26 mL/min — ABNORMAL LOW (ref >60)
Glucose, Bld: 140 mg/dL — ABNORMAL HIGH (ref 70–99)

## 2010-09-13 ENCOUNTER — Inpatient Hospital Stay (HOSPITAL_COMMUNITY): Payer: Medicare Other

## 2010-09-13 LAB — BASIC METABOLIC PANEL
Chloride: 108 mEq/L (ref 96–112)
GFR calc non Af Amer: 31 mL/min — ABNORMAL LOW (ref >60)
Glucose, Bld: 140 mg/dL — ABNORMAL HIGH (ref 70–99)
Potassium: 3.4 mEq/L — ABNORMAL LOW (ref 3.5–5.1)
Sodium: 144 mEq/L (ref 135–145)

## 2010-09-13 LAB — CBC
HCT: 32.7 % — ABNORMAL LOW (ref 39.0–52.0)
Hemoglobin: 10.4 g/dL — ABNORMAL LOW (ref 13.0–17.0)
RBC: 3.32 MIL/uL — ABNORMAL LOW (ref 4.22–5.81)
RDW: 14.6 % (ref 11.5–15.5)
WBC: 10.2 10*3/uL (ref 4.0–10.5)

## 2010-09-13 LAB — GLUCOSE, CAPILLARY
Glucose-Capillary: 128 mg/dL — ABNORMAL HIGH (ref 70–99)
Glucose-Capillary: 146 mg/dL — ABNORMAL HIGH (ref 70–99)

## 2010-09-13 LAB — MAGNESIUM: Magnesium: 2.2 mg/dL (ref 1.5–2.5)

## 2010-09-14 ENCOUNTER — Inpatient Hospital Stay (HOSPITAL_COMMUNITY): Payer: Medicare Other

## 2010-09-14 LAB — BASIC METABOLIC PANEL
CO2: 31 mEq/L (ref 19–32)
Calcium: 7.7 mg/dL — ABNORMAL LOW (ref 8.4–10.5)
GFR calc Af Amer: 41 mL/min — ABNORMAL LOW (ref >60)
GFR calc non Af Amer: 34 mL/min — ABNORMAL LOW (ref >60)
Glucose, Bld: 116 mg/dL — ABNORMAL HIGH (ref 70–99)
Potassium: 3.1 mEq/L — ABNORMAL LOW (ref 3.5–5.1)
Sodium: 144 mEq/L (ref 135–145)

## 2010-09-14 LAB — CBC
HCT: 32.9 % — ABNORMAL LOW (ref 39.0–52.0)
Hemoglobin: 10 g/dL — ABNORMAL LOW (ref 13.0–17.0)
MCHC: 30.4 g/dL (ref 30.0–36.0)
WBC: 8.2 10*3/uL (ref 4.0–10.5)

## 2010-09-14 LAB — CARBOXYHEMOGLOBIN
Carboxyhemoglobin: 1.2 % (ref 0.5–1.5)
O2 Saturation: 70.4 %
Total hemoglobin: 10.3 g/dL — ABNORMAL LOW (ref 13.5–18.0)

## 2010-09-14 LAB — GLUCOSE, CAPILLARY: Glucose-Capillary: 122 mg/dL — ABNORMAL HIGH (ref 70–99)

## 2010-09-15 ENCOUNTER — Inpatient Hospital Stay (HOSPITAL_COMMUNITY): Payer: Medicare Other

## 2010-09-15 LAB — CBC
Hemoglobin: 10.2 g/dL — ABNORMAL LOW (ref 13.0–17.0)
MCH: 30.9 pg (ref 26.0–34.0)
MCHC: 31.6 g/dL (ref 30.0–36.0)
MCV: 97.9 fL (ref 78.0–100.0)
RBC: 3.3 MIL/uL — ABNORMAL LOW (ref 4.22–5.81)

## 2010-09-15 LAB — BASIC METABOLIC PANEL
BUN: 21 mg/dL (ref 6–23)
CO2: 32 mEq/L (ref 19–32)
Calcium: 7.7 mg/dL — ABNORMAL LOW (ref 8.4–10.5)
Chloride: 109 mEq/L (ref 96–112)
Creatinine, Ser: 1.88 mg/dL — ABNORMAL HIGH (ref 0.4–1.5)
Glucose, Bld: 99 mg/dL (ref 70–99)

## 2010-09-15 NOTE — Consult Note (Signed)
[name], [name]                ACCOUNT NO.:  [id number]  MEDICAL RECORD NO.:  [id number]           PATIENT TYPE:  I  LOCATION:  2909                         FACILITY:  [facility]  PHYSICIAN:  Levie Heritage, MD       DATE OF BIRTH:  February 22, 1941  DATE OF CONSULTATION: DATE OF DISCHARGE:                                CONSULTATION   REASON FOR CONSULTATION:  Confusion and altered sensorium status post cardiac arrest.  HISTORY OF PRESENT ILLNESS:  This is a 70 year old male with significant past medical history who presented to [facility] ED initially for shortness of breath on 2010/09/19.  He initially presented to Urgent Care where x-rays of what that was obtained showed bilateral pulmonary infiltrates and thus was instructed to go to [facility] ED.  The patient was seen initially by cardiology and was noted to have bilateral pulmonary infiltrates and BNP over 900.  Initial presentation did show the patient to have confusion.  However, during the ED visit, the patient did wake up and showed to be clearly awake, oriented without any focal deficits.  Thus CT of head was cancelled.  While the patient was in the emergency room, he developed sudden onset of pulseless electrical activity associated with acute respiratory failure and cardiopulmonary arrest.  ACLS algorithm was carried out and the patient returned to spontaneous circulation after 5 minutes of ACL algorithm.  While hospitalized, the patient continued to show confusion and altered sensorium and thus neurology was consulted.  The majority of this information was obtained from the chart as the patient is very confused and limitedly verbal.  PAST MEDICAL HISTORY:  Nonsustained ventricular tachycardia.  He does have a [employer]. Jude defibrillator placed in 2001, ischemic cardiomyopathy with an EF 30% by echocardiogram, coronary artery disease with prior MI in 1993, subsequent bypass grafting in 1993 and non-ST elevation MI  in November 2010, stage III chronic kidney disease and baseline serum creatinine of 1.7.  Discharge creatinine on January 2011 was 1.9, history of gout, dyslipidemia, history of brain tumor resection, history of benign prostatic cancer, hypertension, nephrolithiasis, cholelithiasis, gastroesophageal reflex, previous CVA, and osteoarthritis.  MEDICATIONS:  The patient currently is on alprazolam, Coreg, Imdur, amiodarone, Neurontin 300 mg t.i.d., Protonix, NovoLog, Flomax, Zyloprim, Robitussin.  ALLERGIES:  CORTISONE, NSAID, LIPITOR, and AUGMENTIN.  FAMILY HISTORY:  Positive for CAD.  SOCIAL HISTORY:  Per chart the patient lives alone.  Does not smoke and he is a widower.  REVIEW OF SYSTEMS:  Negative with exception of above.  PHYSICAL EXAMINATION:  Blood pressure is 140/60, pulse 67, respiration is 22 spontaneously with 96% room air.  The patient's temperature is 98.1.  The patient is very drowsy.  When the patient's name is yelled out, he will answer "ya" but when asked where he is, he does not know where he is.  He is very limited in his verbal output.  He remains supine with his eyes closed but will open his eyes when asked to open for brief period of time.  The patient does squeeze hands bilaterally with 5/5 strength.  He will pull arms toward  him with 5/5 strength.  He will push me away with bilateral arms with 5/5 strength.  He lifts his shoulders and his trapezius to shoulder shrug with 5/5.  As far as lower extremities, I noted that he will withdraw from noxious stimuli bilaterally, briskly but does not follow commands to lift his legs.  The patient's pupils are equal, round, and reactive to light at 3-mm to 2- mm.  Extraocular movements are grossly intact.  Tongue is midline.  He is able to move his tongue from side to side.  The patient does not blink to threat but has a blank stare when eyelids are held open.  Deep tendon reflexes are 2+ in the upper extremities, 1+  bilaterally at knees and negligible at the Achilles.  He has equivocal toes bilaterally. Finger-to-nose and heel-to-shin were not obtainable.  LABORATORY DATA:  Sodium is 154, potassium 3.3, chloride is 115, glucose 147, CO2 is 32, BUN is 35, creatinine is 2.4.  The patient's last pCO2 was 58.5.  CBC shows a hemoglobin of 10.9, hematocrit of 34.7, white blood cell count of 8.4, platelets of 107,000.  IMAGING:  CT of head is pending, EEG is pending.  ASSESSMENT and PLAN:  This is a 70 year old Caucasian male with significant past medical history status post 5 minutes of pulseless electrical activity with CPR and resolution of rhythm.  The patient now remains confused in the setting of metabolic derangement.  On exam, the patient does show intact cranial nerves.  He is able to follow limited commands.  The detailed extent of CNS anoxic damage not possible without MRI or when he is able to get a detailed formal neurological exam after the resolution of acute metabolic issues. Recommendations would be to: 1. CT of head and then repeat CT of head in 1 week to evaluate for     extent of damage evaluation. 2. Continue with EEG. 3. Continue with strict glucose control and treating metabolic     abnormalities. 4. Continued medical support.  Dr. Hoy Morn has seen and evaluated the patient and agrees with the above- mentioned.     Felicie Morn, PA-C  I have examined the patient and agree with above clinical findings. ______________________________ Levie Heritage, MD    DS/MEDQ  D:  09/10/2010  T:  09/10/2010  Job:  [id number]  Electronically Signed by Felicie Morn PA-C on 09/15/2010 10:25:16 AM Electronically Signed by Levie Heritage MD on 09/15/2010 02:31:44 PM

## 2010-09-16 ENCOUNTER — Inpatient Hospital Stay (HOSPITAL_COMMUNITY): Payer: Medicare Other

## 2010-09-16 LAB — BLOOD GAS, ARTERIAL
Acid-Base Excess: 3.6 mmol/L — ABNORMAL HIGH (ref 0.0–2.0)
Acid-Base Excess: 3.9 mmol/L — ABNORMAL HIGH (ref 0.0–2.0)
Delivery systems: POSITIVE
Drawn by: 277551
Inspiratory PAP: 12
O2 Saturation: 98.5 %
Patient temperature: 98.6
TCO2: 32.1 mmol/L (ref 0–100)
TCO2: 30.9 mmol/L (ref 0–100)
pH, Arterial: 7.265 — ABNORMAL LOW (ref 7.350–7.450)

## 2010-09-16 LAB — GLUCOSE, CAPILLARY
Glucose-Capillary: 126 mg/dL — ABNORMAL HIGH (ref 70–99)
Glucose-Capillary: 105 mg/dL — ABNORMAL HIGH (ref 70–99)
Glucose-Capillary: 91 mg/dL (ref 70–99)
Glucose-Capillary: 130 mg/dL — ABNORMAL HIGH (ref 70–99)
Glucose-Capillary: 115 mg/dL — ABNORMAL HIGH (ref 70–99)
Glucose-Capillary: 119 mg/dL — ABNORMAL HIGH (ref 70–99)
Glucose-Capillary: 127 mg/dL — ABNORMAL HIGH (ref 70–99)
Glucose-Capillary: 112 mg/dL — ABNORMAL HIGH (ref 70–99)

## 2010-09-16 LAB — POCT I-STAT 3, ART BLOOD GAS (G3+)
Acid-Base Excess: 6 mmol/L — ABNORMAL HIGH (ref 0.0–2.0)
Bicarbonate: 33 mEq/L — ABNORMAL HIGH (ref 20.0–24.0)
O2 Saturation: 100 %
TCO2: 35 mmol/L (ref 0–100)
pO2, Arterial: 375 mmHg — ABNORMAL HIGH (ref 80.0–100.0)

## 2010-09-16 LAB — CBC
MCH: 30.5 pg (ref 26.0–34.0)
Platelets: 124 10*3/uL — ABNORMAL LOW (ref 150–400)
RBC: 3.61 MIL/uL — ABNORMAL LOW (ref 4.22–5.81)

## 2010-09-16 LAB — BASIC METABOLIC PANEL
Calcium: 8.1 mg/dL — ABNORMAL LOW (ref 8.4–10.5)
Chloride: 109 mEq/L (ref 96–112)
Creatinine, Ser: 2.04 mg/dL — ABNORMAL HIGH (ref 0.4–1.5)
GFR calc Af Amer: 39 mL/min — ABNORMAL LOW (ref >60)

## 2010-09-16 LAB — PHOSPHORUS: Phosphorus: 5.6 mg/dL — ABNORMAL HIGH (ref 2.3–4.6)

## 2010-09-16 LAB — MAGNESIUM: Magnesium: 2.5 mg/dL (ref 1.5–2.5)

## 2010-09-17 ENCOUNTER — Inpatient Hospital Stay (HOSPITAL_COMMUNITY): Payer: Medicare Other

## 2010-09-17 LAB — BASIC METABOLIC PANEL
CO2: 30 mEq/L (ref 19–32)
Calcium: 8.5 mg/dL (ref 8.4–10.5)
Creatinine, Ser: 2.36 mg/dL — ABNORMAL HIGH (ref 0.4–1.5)
GFR calc Af Amer: 33 mL/min — ABNORMAL LOW (ref >60)
Sodium: 151 mEq/L — ABNORMAL HIGH (ref 135–145)

## 2010-09-17 LAB — BLOOD GAS, ARTERIAL
Acid-Base Excess: 4.4 mmol/L — ABNORMAL HIGH (ref 0.0–2.0)
FIO2: 0.32 %
O2 Saturation: 93.3 %
Patient temperature: 98.6

## 2010-09-17 LAB — GLUCOSE, CAPILLARY: Glucose-Capillary: 114 mg/dL — ABNORMAL HIGH (ref 70–99)

## 2010-09-17 LAB — MAGNESIUM: Magnesium: 2.6 mg/dL — ABNORMAL HIGH (ref 1.5–2.5)

## 2010-09-17 LAB — CBC
Hemoglobin: 10.8 g/dL — ABNORMAL LOW (ref 13.0–17.0)
MCH: 30 pg (ref 26.0–34.0)
MCHC: 29.3 g/dL — ABNORMAL LOW (ref 30.0–36.0)
Platelets: 115 10*3/uL — ABNORMAL LOW (ref 150–400)
RBC: 3.6 MIL/uL — ABNORMAL LOW (ref 4.22–5.81)

## 2010-09-17 LAB — PROTIME-INR
INR: 1.16 (ref 0.00–1.49)
Prothrombin Time: 15 seconds (ref 11.6–15.2)

## 2010-09-17 LAB — CARBOXYHEMOGLOBIN
Carboxyhemoglobin: 1.6 % — ABNORMAL HIGH (ref 0.5–1.5)
Methemoglobin: 0.6 % (ref 0.0–1.5)
O2 Saturation: 80.7 %

## 2010-09-17 LAB — SODIUM, URINE, RANDOM: Sodium, Ur: 71 mEq/L

## 2010-09-18 ENCOUNTER — Inpatient Hospital Stay (HOSPITAL_COMMUNITY): Payer: Medicare Other

## 2010-09-18 DIAGNOSIS — R079 Chest pain, unspecified: Secondary | ICD-10-CM

## 2010-09-18 LAB — GLUCOSE, CAPILLARY

## 2010-09-18 LAB — CBC
MCH: 30.9 pg (ref 26.0–34.0)
MCV: 102.3 fL — ABNORMAL HIGH (ref 78.0–100.0)
Platelets: 143 10*3/uL — ABNORMAL LOW (ref 150–400)
RBC: 3.5 MIL/uL — ABNORMAL LOW (ref 4.22–5.81)

## 2010-09-18 LAB — BASIC METABOLIC PANEL
BUN: 26 mg/dL — ABNORMAL HIGH (ref 6–23)
CO2: 33 mEq/L — ABNORMAL HIGH (ref 19–32)
Chloride: 111 mEq/L (ref 96–112)
Chloride: 112 mEq/L (ref 96–112)
GFR calc Af Amer: 49 mL/min — ABNORMAL LOW (ref >60)
Glucose, Bld: 126 mg/dL — ABNORMAL HIGH (ref 70–99)
Potassium: 4 mEq/L (ref 3.5–5.1)
Potassium: 3.5 mEq/L (ref 3.5–5.1)
Sodium: 151 mEq/L — ABNORMAL HIGH (ref 135–145)

## 2010-09-19 ENCOUNTER — Inpatient Hospital Stay (HOSPITAL_COMMUNITY): Payer: Medicare Other

## 2010-09-19 DIAGNOSIS — I509 Heart failure, unspecified: Secondary | ICD-10-CM

## 2010-09-19 LAB — GLUCOSE, CAPILLARY
Glucose-Capillary: 151 mg/dL — ABNORMAL HIGH (ref 70–99)
Glucose-Capillary: 137 mg/dL — ABNORMAL HIGH (ref 70–99)
Glucose-Capillary: 131 mg/dL — ABNORMAL HIGH (ref 70–99)
Glucose-Capillary: 114 mg/dL — ABNORMAL HIGH (ref 70–99)

## 2010-09-19 LAB — CARBOXYHEMOGLOBIN
Carboxyhemoglobin: 1.3 % (ref 0.5–1.5)
Methemoglobin: 0.2 % (ref 0.0–1.5)
Total hemoglobin: 10.6 g/dL — ABNORMAL LOW (ref 13.5–18.0)

## 2010-09-19 LAB — BASIC METABOLIC PANEL
BUN: 28 mg/dL — ABNORMAL HIGH (ref 6–23)
GFR calc non Af Amer: 32 mL/min — ABNORMAL LOW (ref >60)
Potassium: 3.6 mEq/L (ref 3.5–5.1)
Sodium: 152 mEq/L — ABNORMAL HIGH (ref 135–145)

## 2010-09-19 LAB — CBC
Platelets: 148 10*3/uL — ABNORMAL LOW (ref 150–400)
RDW: 14.9 % (ref 11.5–15.5)
WBC: 13.4 10*3/uL — ABNORMAL HIGH (ref 4.0–10.5)

## 2010-09-20 ENCOUNTER — Inpatient Hospital Stay (HOSPITAL_COMMUNITY): Payer: Medicare Other

## 2010-09-20 LAB — CBC
MCH: 30.4 pg (ref 26.0–34.0)
MCHC: 30.1 g/dL (ref 30.0–36.0)
MCV: 101.1 fL — ABNORMAL HIGH (ref 78.0–100.0)
Platelets: 147 10*3/uL — ABNORMAL LOW (ref 150–400)
RDW: 15 % (ref 11.5–15.5)

## 2010-09-20 LAB — GLUCOSE, CAPILLARY: Glucose-Capillary: 110 mg/dL — ABNORMAL HIGH (ref 70–99)

## 2010-09-20 LAB — BASIC METABOLIC PANEL
BUN: 33 mg/dL — ABNORMAL HIGH (ref 6–23)
Creatinine, Ser: 2.08 mg/dL — ABNORMAL HIGH (ref 0.4–1.5)
GFR calc non Af Amer: 32 mL/min — ABNORMAL LOW (ref >60)

## 2010-09-22 NOTE — Assessment & Plan Note (Signed)
[facility]                            CARDIOLOGY OFFICE NOTE   KONSTANTINE, GERVASI                       MRN:          [id number]  DATE:12/20/2007                            DOB:          06-10-1940    [name] is seen for followup.  Most recently, he has had some  dizziness.  He has bradycardia.  This has been known in the past.  He  had some presyncope, but not true syncope.  He has carotid disease.  His  Doppler has been repeated recently and he has 60-79% left internal  carotid artery stenosis that is stable.   PAST MEDICAL HISTORY:   ALLERGIES:  AUGMENTIN and CORTISONE.   MEDICATIONS:  1. Potassium.  2. Aspirin.  3. Lasix.  4. Nexium.  5. Hydralazine.  6. Amlodipine.  7. Clonidine.  8. Coreg 12.5 b.i.d.  9. Allopurinol.   OTHER MEDICAL PROBLEMS:  See the extensive list on my note of June 05, 2007.   REVIEW OF SYSTEMS:  Today, he feels relatively well.  Review of systems  is negative.   PHYSICAL EXAMINATION:  VITAL SIGNS:  Weight is 245 pounds, which is  stable for him.  Blood pressure is 142/75.  Pulse is 43.  GENERAL:  The patient is oriented to person, time, and place.  Affect is  normal.  HEENT:  Reveals no xanthelasma.  He has normal extraocular motion.  There is a soft carotid bruit.  There is no jugular venous distention.  LUNGS:  Clear.  Respiratory effort is not labored.  CARDIAC:  Reveals an S1 with an S2.  There are no clicks.  ABDOMEN:  Soft.  He has no significant peripheral edema.   EKG reveals marked sinus bradycardia.  There are no acute changes.   Problems are listed on the note of June 05, 2007.  Problem #21:  Mild bradycardia.  We have not documented hypotension.  He  is on many meds for his blood pressure.  His heart rate is slow.  I have  chosen of lower his Coreg dose.  I will then see him back.  We will  consider a Holter and a event recorder.  Watch his carotids  carefully.  I may lower his  blood pressure meds.  Problem #22:  Presyncope.  See the discussion above.     [name], MD, [facility]  Electronically Signed    JDK/MedQ  DD: 12/20/2007  DT: 12/21/2007  Job #: [id number]   cc:   Rosalyn Gess. Norins, MD

## 2010-09-22 NOTE — Op Note (Signed)
[name], [name] NO.:  [id number]   MEDICAL RECORD NO.:  [id number]          PATIENT TYPE:  OIB   LOCATION:  1419                         FACILITY:  [facility]   PHYSICIAN:  Ardeth Sportsman, MD     DATE OF BIRTH:  08-17-1940   DATE OF PROCEDURE:  02/08/2007  DATE OF DISCHARGE:                               OPERATIVE REPORT   PRIMARY CARE PHYSICIAN:  Illene Regulus, I believe at [facility].   GASTROENTEROLOGIST:  Stanford Breed with [facility] gastroenterology.   CARDIOLOGIST:  Willa Rough.   DIAGNOSIS:  Abdominal pain with known gallstones, probable symptomatic  cholecystolithiasis.   POSTOPERATIVE DIAGNOSES:  1. Abdominal pain with known gallstones, probable symptomatic      cholecystolithiasis.  2. Probable chronic cholecystitis.  3. Steatohepatitis.   PROCEDURE PERFORMED:  Laparoscopic cholecystectomy with intraoperative  cholangiogram.   SURGEON:  Karie Soda, M.D.   ASSISTANT:  Norval Gable, P.A. student.   ANESTHESIA:  1. General anesthesia.  2. Local anesthetic in a field block at all port sites.   SPECIMEN:  Gallbladder.   DRAINS:  None.   ESTIMATED BLOOD LOSS:  10 mL.   COMPLICATIONS:  None apparent.   INDICATIONS:  [name] is a 70 year old gentleman with coronary disease  and reflux disease who has had intermittent bouts of abdominal pain and  has had an extensive workup including upper and lower endoscopy that has  been normal but known gallstones.  His symptoms seemed to be initially  vague but seemed more consistent with biliary colic.   The anatomy and physiology of hepatobiliary and pancreatic function was  discussed.  Pathophysiology of cholecystolithiasis with its risks of a  worsening pain, nausea, vomiting, gallstone pancreatitis,  choledocholithiasis and cholecystitis was discussed.  Options were  discussed and recommendations made for laparoscopic cholecystectomy with  intraoperative cholangiogram.  Risks such as stroke,  heart attack, deep  vein thrombosis, pulmonary embolism and death were discussed.  Risks  such as bleeding, transfusion, wound infection, abscess, injury to other  organs, prolonged pain, bile duct injuries resulting in the need of  operative drainage/stenting/reconstruction, persistent abdominal pain -  arguing that the gallbladder is not  the true etiology of the pain, and  other risks were discussed.  Questions answered.  He agreed to proceed.   OPERATIVE FINDINGS:  He had steatohepatitis.  He had some mild  gallbladder wall thickening.  Cholangiogram appeared normal.   DESCRIPTION OF PROCEDURE:  Informed consent was confirmed.  The patient  received IV cefazolin given his numerous health issues.  He urinated  just prior to coming to the operating room.  He had sequential  compressive devices active during the entire case.  He had held his  aspirin prior to surgery.  He underwent general anesthesia without any  difficulty.  He was positioned supine, both arms tucked.  His abdomen  was prepped and draped in a sterile fashion.   Entry was gained in the abdomen with the patient in steep reversed  Trendelenburg and right side up through a right upper quadrant stab  incision using a 5-mm/0-degree scope  to place a port.  Capnoperitoneum  to 15 mmHg provided good abdominal insufflation.  Under direct  visualization, 5 more ports were placed in the right flank through the  umbilicus, and a 10-mm was tunneled through the falciform ligament in  the subxiphoid region with good result.   Gallbladder fundus was grasped and elevated cephalad.  Peritoneal  coverings through the anteromedial and posterolateral gallbladder were  freed off circumferentially.  Unfortunately during this, there was some  breach of his gallbladder wall with spillage of clear bile.  There was  no spillage of stones.  Circumferential dissection was done to free the  proximal third of the gallbladder off the liver bed for  a classic  critical view.  One structure was pulsatile going to the anteromedial  wall consistent with the cystic artery.  One clip on the gallbladder  side and two clips slightly medial were placed, and the cystic artery  transection was completed.   This left one structure going from the gallbladder infundibulum down to  the porta hepatis consistent with cystic duct.  Duct was skeletonized.  The clip was placed in the infundibulum, and a partial cyst ductotomy  was performed.   A 5-French cholangiocatheter was placed through a stab incision, the  right upper quadrant flushed.  Initially tried to place it through the  cystic duct, but it would pass more than 5 mm.  In trying to reposition,  the gallbladder shredded off the cystic duct.  I was able to hold the  cystic duct stump and ultimately get a catheter through about a  centimeter and had good occlusion with a single clip.  Cholangiogram was  run using diluted radio-opaque contrast.  Contrast flowed well through a  helical side branch consistent with cystic duct cannulization.  Contrast  flowed in the common hepatic duct and refluxed into the right and left  intrahepatic chains well.  Contrast flowed easily down the common bile  duct, across a normal appearing ampullary and a normal-appearing  duodenum consistent with a normal cholangiogram.  Cholangiogram catheter  was removed.  The cystic duct stump was ligated using a 0 PDS Endoloop  for complete circumferential occlusion around it.  Four clips were  placed, one slightly proximal and three distal to the stump to good  result.   The gallbladder was freed from its remaining attachments on the liver,  and hemostasis was ensured.  Copious irrigation of 4 liters of saline  was done with clear return.  There was no leak of bile or blood.  There  was no injury to any organs.  The gallbladder was placed inside an  EndoCatch bag and removed out the subxiphoid port requiring no extra   dilation.  Defect was too small to allow my pinky to pass, so I did not  do any more aggressive fascial closure in the subxiphoid region.  Up to  3 abdominal ports were removed.  No leaking of blood on the peritoneum  or skin side.  Capnoperitoneum was actively aspirated out and umbilical  port was removed.  Skin was closed using 4-0 Monocryl stitch.   The patient was extubated and sent to the recovery room in stable  condition.  There had been some mild ST changes, and so a postoperative  EKG is being set, but he had been hemodynamically stable throughout the  case, and the  feeling that this was probably there preoperatively. As a result, we  will do a formal evaluation just to be  sure.  If there is any concern,  we will monitor overnight.  Otherwise, if EKG looks find and he looks  stable, he is probably safe to go home.  I am about to talk to the  family about the operative findings.      Ardeth Sportsman, MD  Electronically Signed     SCG/MEDQ  D:  02/08/2007  T:  02/08/2007  Job:  [id number]   cc:   Rosalyn Gess. Norins, MD  [address]  [place]  [place] [address]   Stanford Breed, M.D.

## 2010-09-22 NOTE — Discharge Summary (Signed)
[name], [name]                ACCOUNT NO.:  [id number]   MEDICAL RECORD NO.:  [id number]          PATIENT TYPE:  OBV   LOCATION:  3735                         FACILITY:  [facility]   PHYSICIAN:  Valerie A. Felicity Coyer, MDDATE OF BIRTH:  1940-07-03   DATE OF ADMISSION:  05/24/2007  DATE OF DISCHARGE:  05/25/2007                               DISCHARGE SUMMARY   DISCHARGE DIAGNOSES:  1. Bradycardia with uncontrolled hypertension.  2. Hyperglycemia.   HISTORY OF PRESENT ILLNESS:  [name] is a 70 year old white male with  a significant cardiac history admitted from the ED on May 24, 2007,  with uncontrolled hypertension.  Patient takes his blood pressure at  home several times a day and noted since the Monday prior to admission  that his blood pressure readings have been high.  In addition to this,  patient reports episode of generalized weakness after waking up in the  middle of the night on Tuesday prior to admission.  He reports weakness  lasted approximately 15 minutes with no disturbance in gait or  disturbance in vision and resolved spontaneously.  Patient also reported  an episode of left arm pain on the morning of January 14th with no  radiation that lasted approximately 5 minutes.  He denied any associated  symptoms with this arm pain.  Upon evaluation in the ER, patient denied  any recent chest pain, shortness of breath, headache, blurred vision,  recent fever, or any increased swelling in the lower extremities.  Because of patient's complex medical history and several doses of p.o.  clonidine given in the ER with continued high blood pressure, patient  was admitted for further evaluation.   PAST MEDICAL HISTORY:  1. CAD status post CABG in 1993.  2. Ischemic cardiomyopathy with an LVEF in September 2008 of 35% to      40%.  3. Hypertension.  4. Chronic renal insufficiency with a baseline creatinine of 1.6,      followed by Dr. Hyman Hopes.  5. Hyperlipidemia.  6. Gout.  7.  GERD.  8. History of brain tumor status post resection.  9. Carotid disease, followed by Dr. Madilyn Fireman.  10.History of melanoma, followed at [facility].  11.Osteoarthritis.   COURSE OF HOSPITALIZATION:  1. Bradycardia with uncontrolled hypertension.  Patient was admitted      from the ER for observation.  No acute ischemia on EKG.  No signs      or symptoms of neurological event.  It was decided to hold Coreg on      admission and change the isosorbide to Imdur with increased dose      due to patient's bradycardia.  Patient's cardiac enzymes are cycled      which were negative.  His TSH was normal.  There are no cardiac      arrhythmias on the monitor.  Patient's dig level was 0.6, therefore      nontoxic and not thought to be contributing to bradycardia.      Patient will be discharged on decreased dose of Coreg, as well as      the Imdur  at increased dose and clonidine at increased dose.  He is      aware of need for close followup with Dr. Debby Bud.  2. Hyperglycemia.  Random glucose on admission revealed a blood sugar      of 144.  A1c on this admission was 6.0.  No intervention at this      time.   MEDS AT TIME OF DISCHARGE:  1. Coreg 12.5 mg p.o. b.i.d., note this is a new dose.  2. Imdur 120 mg p.o. daily, note this is a new med.  3. Clonidine 0.1 mg p.o. t.i.d., note this is an increased dose.  4. Hydralazine 50-mg tab take 1 midday and 1 q.h.s.  5. Lasix 40 mg p.o. b.i.d.  6. Potassium chloride 40 mEq p.o. b.i.d.  7. Aspirin 81 mg p.o. daily.  8. Digoxin 0.25 mg p.o. daily.  9. Nexium 40 mg p.o. daily.  10.Felodipine 10 mg p.o. daily.  11.Allopurinol 300 mg p.o. daily.   LABS AT TIME OF DISCHARGE:  Dig level 0.6.  BNP 312.  Patient appears  compensated on exam.  TSH normal at 1.593.  Hemoglobin A1c 6.0.  Cardiac  enzymes were negative x2.   DISPOSITION:  Patient is to be discharged with close followup with Dr.  Debby Bud scheduled for January 19th at 3:30.   Patient is aware of this  appointment and is instructed to bring his new medication list at time  of appointment.      Valerie A. Felicity Coyer, MD  Electronically Signed     VAL/MEDQ  D:  05/25/2007  T:  05/25/2007  Job:  [id number]

## 2010-09-22 NOTE — Discharge Summary (Signed)
NAMECADENCE, HASLAM                ACCOUNT NO.:  [id number]   MEDICAL RECORD NO.:  [id number]          PATIENT TYPE:  OBV   LOCATION:  3735                         FACILITY:  [facility]   PHYSICIAN:  Valerie A. Felicity Coyer, MDDATE OF BIRTH:  1940-06-07   DATE OF ADMISSION:  10/14/2008  DATE OF DISCHARGE:  10/15/2008                               DISCHARGE SUMMARY   DISCHARGE DIAGNOSES:  1. Dyspnea, rule out cardiac etiology, negative this admission,      continue with outpatient followup, see details below.  2. Chest pain with history of coronary artery disease, no evidence of      acute coronary events this hospitalization.  3. Chronic systolic heart failure with ischemic cardiomyopathy, no      evidence of acute exacerbation.  4. Coronary artery disease, status post coronary artery bypass graft      in 1993.  5. Chronic kidney disease stage III, creatinine 1.46 at the time of      discharge.  6. History of peripheral vascular disease.  7. Dyslipidemia.  8. Gastroesophageal reflux disease.  9. History of gout.  10.History of brain tumor, status post resection remotely.  11.History of remote melanoma, status post resection.  12.Bradycardia with hospitalization in January 2010 and medication      changes.  13.History of hypertension, suboptimally controlled, but complicated      due to bradycardia, outpatient followup with cardiology.   DISCHARGE MEDICATIONS:  As prior to admission without change and  include:  1. Restasis eye drops 2 drops bilaterally b.i.d.  2. Clonidine 0.2 mg t.i.d.  3. Aspirin 81 mg daily.  4. Colchicine 0.6 mg daily p.r.n. gout symptoms.  5. Coreg 12.5 mg p.o. b.i.d.  6. Digitek 12.5 p.o. daily.  7. Felodipine 10 mg p.o. daily.  8. Furosemide 40 mg b.i.d.  9. Hydralazine 25 mg t.i.d.  10.Isosorbide dinitrate 10 mg b.i.d.  11.Nexium 40 mg daily.  12.Potassium 30 mEq a.m. plus 15 mEq p.m.  13.Allopurinol 300 mg daily.   DISPOSITION:  The patient is  discharged home in medically stable and  improved condition.  He has had no further symptoms of dyspnea or chest  pain during this observation.  Hospital followup will be with  cardiologist, Dr. Myrtis Ser of [facility] next week as scheduled for  Friday, October 25, 2008, also with primary care physician, Dr. Illene Regulus, call to arrange appointment in the next 2-3 weeks.   CONDITION ON DISCHARGE:  Medically improved and stable.   HOSPITAL COURSE:  Dyspnea, rule out cardiac etiology.  The patient is a  70 year old gentleman with multiple medical problems as listed above,  who had recently been evaluated in the emergency room at [facility] the day prior on October 13, 2008, with complaints of chest  discomfort and shortness of breath.  There he underwent a CT chest angio  which was negative for PE.  Chest x-ray was normal and a single set of  cardiac enzymes was negative, and he was discharged home with the  diagnosis of bronchitis, but no other medical treatment.  Due to  recurrent  symptoms the following morning, he came then to the [facility]  Emergency Room where he was referred for admission due to his complex  cardiac history.  He was monitored on telemetry.  Cardiac enzymes were  cycled and negative.  He underwent a repeat 2-D echo this  hospitalization that showed persisting depression of systolic function  with an LVEF of 20-25% and mild diastolic dysfunction.  By Doppler  parameters, the patient has had no recurrent symptoms.  He is anxious  for discharge home, and it is felt at this time that his dyspnea  symptoms are multifactorial and that he may benefit from further  cardiopulmonary stress testing, but we will defer this to his  cardiologist at his request.  As there are no other acute issues that  require further inpatient workup, he is felt stable for discharge at  this time.  Telemetry has been unremarkable, and these plans have been  reviewed with the patient as  described here.  Please see hospital chart  for further details.      Valerie A. Felicity Coyer, MD  Electronically Signed     VAL/MEDQ  D:  10/15/2008  T:  10/16/2008  Job:  [id number]

## 2010-09-22 NOTE — Assessment & Plan Note (Signed)
[facility]                            CARDIOLOGY OFFICE NOTE   [name], [name]                       MRN:          [id number]  DATE:06/27/2008                            DOB:          11-26-1940    [name] is here for followup.  Recently, he was hospitalized at  [facility].  He had some discomfort in his shoulder, in his neck,  and he was seen in the emergency room.  His pressure was quite high and  he felt fatigue and he was admitted.  Physician's actually called me and  we discussed some changes in his meds.  Hydralazine dose was increased.  He was relatively bradycardic and his carvedilol dose was decreased.  There may have been a change in his potassium dosing and may have been  decreased.  A BMET will be obtained today.   He is feeling okay at this time.  He is not as chipper as usual.  He  continues to help take care of his wife, who has significant  Alzheimer's.   PAST MEDICAL HISTORY:   ALLERGIES:  AUGMENTIN, CORTISONE, and LEVAQUIN.   MEDICATIONS:  See the complete flow sheet in the chart.   REVIEW OF SYSTEMS:  He has no fevers or chills.  He is not currently  having any headaches.  He has no GI or GU symptoms.  There are no skin  rashes.  His review of systems is negative.   PHYSICAL EXAMINATION:  VITAL SIGNS:  Blood pressure is 130/74 with a  pulse of 55.  Weight today is 243 pounds.  GENERAL:  The patient is oriented to person, time, and place.  Affect is  normal, although he is not as chipper as usual.  HEENT:  No xanthelasma.  He has normal extraocular motion.  NECK:  There are no carotid bruits.  There is no jugular venous  distention.  LUNGS:  Clear.  Respiratory effort is not labored.  CARDIAC:  S1 with an S2.  There are no clicks or significant murmurs.  ABDOMEN:  Soft.  EXTREMITIES:  He has no significant peripheral edema.   PROBLEMS:  Those listed on my complete note of June 05, 2007.  #1.  Coronary  artery disease, stable.  #2.  Decreased left ventricular function.  We have adjusted the medicine  the best we can.  #3.  History of creatinine in the range of 1.5.  BMET will be rechecked  today.  #8.  History of carotid disease.  His most recent Doppler done in June  2009 revealed mild disease in the right and 60-79% left internal carotid  artery stenosis on the left.  He will need followup of this soon.  #17.  Volume overload.  This is currently stable.  #20.  Hypertension.  He asked why he has had recent increasing blood  pressures.  We will consider a renal Dopplers over time.  For now, we  will continue with the current medications.  #21.  Mild bradycardia.  We will follow him on the current dose of  Coreg.   I will then  see him for followup.     [name] Abed, MD, [facility]  Electronically Signed    JDK/MedQ  DD: 06/27/2008  DT: 06/27/2008  Job #: [id number]

## 2010-09-22 NOTE — Assessment & Plan Note (Signed)
[place] [facility]                            CARDIOLOGY OFFICE NOTE   [name], [name]                       MRN:          [id number]  DATE:08/01/2008                            DOB:          31-Aug-1940    [name] is seen for a followup.  He had been having some problems with  dizziness.  He is feeling better now.  He has some bursitis, but no  active gout.  We checked the BMET when he was here last.  His potassium  was 3.5.  This is on 40 mEq a day of potassium.  We will increase it to  60.  He is on Lasix.  His volume status is stable.   PAST MEDICAL HISTORY:   ALLERGIES:  AUGMENTIN, CORTISONE, and LEVAQUIN.   MEDICATIONS:  See the EMR sheet.   OTHER MEDICAL PROBLEMS:  See the complete list on my note of June 05, 2007.   REVIEW OF SYSTEMS:  Today, he mentioned that he has a new problem with  his eyes.  He has some macular degeneration.  He is not having any  fevers or chills.  He has no major headaches at this point.  There is no  shortness of breath.  There is no chest pain.  There is no GI or GU  symptoms.  Otherwise, review of systems is negative.   PHYSICAL EXAMINATION:  VITAL SIGNS:  Blood pressure is 140/70.  His  pulse is 80.  GENERAL:  The patient is oriented to person, time, and place.  Affect is  normal.  HEENT:  No xanthelasma.  He has normal extraocular motion.  NECK:  There is a right carotid bruit.  LUNGS:  Clear.  Respiratory effort is not labored.  CARDIAC:  An S1 with an S2.  There are no clicks or significant murmurs.  ABDOMEN:  Soft.  EXTREMITIES:  There is no peripheral edema.   [name] is stable.  His dizziness is stable.  His volume status is  stable.  Blood pressure is reasonably controlled.  Coronary disease is  stable.  No change in his meds today other than increasing his potassium  from 40 to 60 mEq a day.  I will see him back in 3 months.  He has an  additional problem of macular degeneration.     [name], MD, [facility]  Electronically Signed    JDK/MedQ  DD: 08/01/2008  DT: 08/02/2008  Job #: [id number]

## 2010-09-22 NOTE — Assessment & Plan Note (Signed)
[place] [facility]                            CARDIOLOGY OFFICE NOTE   [name], [name]                       MRN:          [id number]  DATE:04/10/2007                            DOB:          [birth year]    [name] is here for follow-up.  I saw him last on March 30, 2007.  He as stable.  He as not having any significant gout symptoms.  He had  been on allopurinol in the past and we decided to restart it.  Several  days after starting his allopurinol, he had a gouty attack.  I am not  absolutely convinced that it was related to the allopurinol.  He is  feeling better.  I believe it will be helpful for him to stay on the  allopurinol, therefore he will continue it.  He gets pain in his right  foot.  This is now improving.   He has not had any chest pain or shortness of breath.   PAST MEDICAL HISTORY:  See the note of March 30, 2007.   ALLERGIES:  See the note of March 30, 2007.   MEDICATIONS:  See the note of March 30, 2007.   REVIEW OF SYSTEMS:  Discomfort in his right foot is the only issue.  Otherwise, he is stable.   PHYSICAL EXAMINATION:  VITAL SIGNS:  Blood pressure 148/77 with pulse  48.  GENERAL APPEARANCE:  He is oriented to person, time and place.  LUNGS:  Clear.  CARDIOVASCULAR:  Exam reveals an S1 with an S2 but no clicks or  significant murmurs.   PROBLEMS:  Listed extensively on the note of November 08, 2006.  See the  discussion about his gouty attack above.  He will remain on allopurinol.  His carotid disease is to be checked today.  His volume status is  stable.  I will then see him for follow-up.     [name], MD, [facility]  Electronically Signed    JDK/MedQ  DD: 04/10/2007  DT: 04/10/2007  Job #: [id number]

## 2010-09-22 NOTE — Assessment & Plan Note (Signed)
[name]                            CARDIOLOGY OFFICE NOTE   [name], [name]                       MRN:          [id number]  DATE:06/05/2007                            DOB:          [birth year]    [name] called today not and was not feeling well.  I was able to  adjust my scheduled to see him in the office this morning.  Recently he  was hospitalized and he was on the primary care service.  He was in the  hospital from January 14 to January 15.  At that time his pressure was  higher than usual.  He was feeling poorly and he was admitted to be  observed, and his pressure stabilized.  His resting heart rate was slow  and therefore his carvedilol was decreased from 25 b.i.d. to 12.5  b.i.d..  Clonidine does in fact was increased and this might have helped  blood pressure but may in fact play a different type of role in his  bradycardia.  Also his nitrate was changed to 120 mg daily in the  morning.  He says now that in the morning his blood pressure is high.  He feels somewhat sleepy.  He is not having any headaches.  He has not  had syncope or presyncope.  However, he is feeling somewhat weak  overall.  He does have dry mouth from the clonidine.  He has some mild  shortness of breath.  He knows that his weight is in fact stable in  terms of his volume status.  He is not having any significant chest  pain.   PAST MEDICAL HISTORY:   ALLERGIES:  AUGMENTIN and CORTISONE.   MEDICATIONS:  Potassium, aspirin, colchicine p.r.n., Lasix 40 b.i.d.,  Nexium, hydralazine 50 one dose middle of the day and one dose at night,  felodipine 10, clonidine 0.1 t.i.d. (to be changed today to b.i.d.),  isosorbide mononitrate 120 mg.  This will be changed from the morning to  the evening.  Coreg 12.5 b.i.d. and allopurinol 300.   OTHER MEDICAL PROBLEMS:  See the complete list below.   REVIEW OF SYSTEMS:  See the HPI.  Otherwise his review of systems is  negative.   PHYSICAL EXAM:  Weight today is 248 pounds which is down 3 pounds from  the last visit here April 10, 2007.  The patient is oriented to person, time and place.  Affect is normal.  HEENT:  Reveals no xanthelasma.  Normal extraocular motion.  There is a soft right carotid bruit.  Lungs are clear.  Respiratory effort is not labored.  Cardiac exam reveals S1-S2.  There are no clicks or significant murmurs.  His abdomen is obese but soft.  He has no significant peripheral edema.   PROBLEMS:  1. Coronary disease with CABG in 1993 and catheterization 2006      revealing new occlusion of vein graft that probably occurred before      the admission at that time.  No ongoing chest pain.  2. Decreased left ventricular function.  Ejection fraction has been in  the 40% range  and I have decided that he is not a candidate for      ICD placement at this time.  3. Creatinine in the range of 1.5.  He was evaluated at one point by      Dr. Elvis Coil.  He is to be absolutely kept off of nonsteroidal      anti-inflammatory medicines.  4. History of brain tumor that was removed successfully in the past.  5. Intolerant to Niaspan.  6. Bilateral by discomfort in the past that is not vascular.  7. Gout.  He is on p.r.n. colchicine and he is on allopurinol.  8. Carotid disease.  This has been followed over time.  His most      recent Doppler was April 10, 2007.  He has 60-79% left internal      carotid stenosis and mild right internal carotid stenosis.  Plan is      for follow-up in 6 months.  9. Elevated cholesterol he had historically has never tolerated any      cholesterol medicines.  10.History of a significant melanoma that was removed at [facility] in the      past.  11.History of some bronchitis currently not a problem.  12.History of mild dilatation of the aortic root.  This has been      stable.  13.History of back pain that is musculoskeletal.  14.History of renal and liver  cyst on CT in the past.  15.History of some mild dizziness historically.  16.History of her reaction to AUGMENTIN with swelling of the tongue.  17.Volume overload.  He is paying much better attention to this.  His      volume status is stable on his current meds.  18.History of an abdominal bruit in the past and ultrasound showed no      evidence of an abnormality of his aorta.  He also had no evidence      of renal artery stenosis.  19.Difficulty with dealing with the sorrow of his wife's developing      severe Alzheimer's.  He continues to work with this issue and this      continues to be an ongoing problem.  20.His current problems with hypertension.  At this point we will      continue to follow him and adjust his meds.  21.Mild bradycardia.  He has not had symptomatic bradycardia.  His      pressure was high when his heart rate was low during his recent      hospitalization.  However, I will keep him on the lower dose of      Coreg for now.   The patient's most recent hospitalization was when he was asymptomatic  with elevated blood pressure and some decreased heart rate.  He does not  feel well in general.  I am not sure why today.  However, we will adjust  clonidine back down to 0.1 b.i.d. and will have him take his nitrate  night so that his blood pressure hopefully is better in the morning.  I  will see him back for early follow-up and hope that we can stabilize how  he is feeling in general.  This was an extended office visit and an add-  on.     [name], MD, [facility]  Electronically Signed    JDK/MedQ  DD: 06/05/2007  DT: 06/05/2007  Job #: [id number]   cc:   Rosalyn Gess. Norins, MD

## 2010-09-22 NOTE — Assessment & Plan Note (Signed)
[facility]                            CARDIOLOGY OFFICE NOTE   [name] [name]                       MRN:          [id number]  DATE:07/17/2007                            DOB:          1941-01-07    [name] [name] is doing well.  See my extensive note of June 05, 2007.  In  addition, I saw him briefly on June 09, 2007.  He is actually doing  well.  As I see him back, he has had only mild discomfort in his right  elbow.  On one occasion he took some colchicine and he thinks that helps  but it may have taken a few days.  At this point he is stable.  He is  not having any significant problems.   PAST MEDICAL HISTORY:  Allergies and medications see the prior notes in  the chart.   REVIEW OF SYSTEMS:  His review of systems is negative.   PHYSICAL EXAM:  Blood pressure is 120/68.  Pulse is 52.  The patient is  oriented to person, time and place.  Affect is normal.  HEENT:  Reveals  no xanthelasma.  He has normal extraocular motion.  There are no carotid  bruits.  There is no jugular venous distention.  LUNGS:  Clear. Respiratory effort is not labored.  Cardiac exam reveals S1-S2.  There are no clicks or significant murmurs.  His abdomen is soft.  He has old chronic mild edema in his right leg.   The patient is stable.  Problems are listed numbers 1 through 21 on my  note of June 05, 2007.  Overall, he is stable.  No change in his  meds.  I will see him back in 6 months.     [name] Abed, MD, [facility]  Electronically Signed    JDK/MedQ  DD: 07/17/2007  DT: 07/17/2007  Job #: [id number]   cc:   [name] Gess. Norins, MD

## 2010-09-22 NOTE — Assessment & Plan Note (Signed)
[facility]                            CARDIOLOGY OFFICE NOTE   [name], [name]                       MRN:          [id number]  DATE:01/17/2008                            DOB:          August 05, 1940    HISTORY OF PRESENT ILLNESS:  [name] is here for a followup.  He has  done relatively well.  When I saw him last, he had some dizziness.  We  had adjusted his Coreg dose slightly.  He has brought blood pressure  checks for me.  On a regular basis his pressure ranges from 125-145  systolic with a diastolic in the range of 55-75.  His pulse remains in  the range of 45-55.  He did have one slight dizzy spell the day before  yesterday.  I am inclined to continue to watch him on his current  medicines doses and without doing any other testing.   PAST MEDICAL HISTORY:   ALLERGIES:  AUGMENTIN, CORTISONE, and LEVAQUIN   MEDICATIONS:  1. Potassium.  2. Aspirin.  3. Lasix.  4. Hydralazine.  5. Amlodipine.  6. Clonidine.  7. Allopurinol.  8. Carvedilol 6.25 b.i.d.  9. Lanoxin 0.25.   OTHER MEDICAL PROBLEMS:  See the complete list on the note of June 05, 2007.   REVIEW OF SYSTEMS:  He mentions that from time to time he still has  intermittent pain in his right ankle and he had some swelling in his  right hand.  He thinks that he had been told that some of his symptoms  were not gout.  However, it would appear to me that continuing to use  p.r.n. colchicine will be effective for him.  Also, he wants to use a  pain medication that is a higher dose of acetaminophen and this will be  safe for him on a limited basis.   Otherwise review of systems is negative.   PHYSICAL EXAMINATION:  VITAL SIGNS:  Blood pressure is 122/72 with a  pulse of 52.  GENERAL:  The patient is oriented to person, time, and place.  Affect is  normal.  HEENT:  Reveals no xanthelasma.  He has normal extraocular motion.  NECK:  There is a soft carotid bruit.  There is no  jugular venous  distention.  LUNGS:  Clear.  Respiratory effort is not labored.  CARDIAC:  Reveals S1 and S2.  There is a soft systolic murmur.  ABDOMEN:  Soft.  EXTREMITIES:  He has peripheral edema today.  He has no joint swelling  as of today.   PROBLEMS:  Problems are listed extensively on the note of June 05, 2007.  #8.  Carotid artery disease.  He did have a followup Doppler in June  2009, and it was stable, but he needs a 13-month followup.  #17.  Volume overload.  This is stable and support hose are helping him.  #21.  Mild bradycardia.  This is stable today, that will be kept in  mind.  #22.  Episodes of dizziness.  He has not had documented hypotension.  I  will not change  his medications, but I will see him back in 3-4 weeks  and we will continue to keep the possibility of event recorder or Holter  monitor in mind.     [name], MD, [facility]  Electronically Signed    JDK/MedQ  DD: 01/17/2008  DT: 01/18/2008  Job #: [id number]   cc:   Rosalyn Gess. Norins, MD

## 2010-09-22 NOTE — Assessment & Plan Note (Signed)
[facility]                         GASTROENTEROLOGY OFFICE NOTE   [name], [name]                       MRN:          [id number]  DATE:12/05/2006                            DOB:          [birth year]    CHIEF COMPLAINT:  Epigastric pain and diarrhea.   HISTORY:  [name] is a 70 year old white male known to Dr. Marina Goodell.  He  has history of coronary artery disease and is status post prior bypass,  has chronic renal insufficiency, a remote brain tumor and recurrent  gout.  He was last seen by Dr. Marina Goodell July 13, 2006 with complaints of  abdominal bloating and discomfort which were felt likely secondary to  constipation.  He has been maintained on MiraLax and Nexium.  He had had  an upper abdominal ultrasound which showed 1 gallstone 11.9-mm, there  was some increase in hepatic chodensity, no gallbladder wall thickening,  no ductal dilation.   At this time patient says that his abdominal discomfort tends to come  and go, has been mild but that he had an episode last evening which is  worse than anything he had had previously.  He rates this as a 6 out of  10 and says that the pain was fairly constant and lasted for multiple  hours last night.  He said he went to bed at about 11 and was awakened  at 11:30 with the upper abdominal pain and was unable to sleep most of  the night.  He describes it as a full feeling in his epigastrium  associated with burping and gas, some nausea but no vomiting.  His  appetite has been decreased.  He had not had any fever or chills.  He  did have 3-4 diarrheal stools last night, no melena or hematochezia.  He  feels a little bit better today, continues to have the loose stools.  No  dysuria, urgency or frequency.  He has been on colchicine over the past  few weeks because of recurrent gout.   CURRENT MEDICATIONS:  1. Clonidine 0.1 b.i.d.  2. Carvedilol 25 b.i.d.  3. Klor-Con 20 mEq b.i.d.  4. Baby aspirin one daily.  5. Isosorbide 10 t.i.d.  6. Digitek 0.25 daily.  7. Hydralazine 25 t.i.d.  8. Nexium 40 daily.  9. Colchicine 0.6 t.i.d.  10.Felodipine 5 mg daily.  11.Lasix 40 one and a half tablets q.a.m. and one tablet q.p.m.   INTOLERANCES/ALLERGIES:  AUGMENTIN CAUSES CONFUSION AND CORTISONE.   EXAMINATION:  Well-developed white male in no acute distress, his  temperature is 98.7, blood pressure 130/72, pulse is 52, weight is 259.  HEENT:  Nontraumatic/normocephalic, EOMI, PERLA, sclerae are anicteric.  NECK:  Supple without nodes.  CARDIOVASCULAR:  Regular rate and rhythm with S1 and S2.  No murmur, rub  or gallop.  PULMONARY:  Clear to A&P.  ABDOMEN:  Soft, bowel sounds are active.  He is tender mildly in the  right upper quadrant and epigastrium.  There is no guarding or rebound,  no mass or hepatosplenomegaly.  RECTAL:  Exam is not done today.   IMPRESSION:  1.  A 70 year old white male with acute episode of upper abdominal      pain, bloating and gas with diarrhea.  He does have documented      cholelithiasis, it is unclear if this episode represents biliary      cholic or a gastroenteritis.  2. Coronary artery disease.  3. Chronic renal insufficiency.  4. Gout on colchicine.   PLAN:  1. Repeat abdominal ultrasound.  2. Check CBC with diff, CMET, amylase and lipase today.  3. Liquid diet over the next 24 hours and gradually advance.  4. Patient was offered pain medication for p.r.n. use, he says he has      hydrocodone at home and was advised that he could use this one      every 4-6 hours as needed for pain.   Further plans pending results of ultrasound and CBC.      Mike Gip, PA-C  Electronically Signed      Venita Lick. Russella Dar, MD, [facility]  Electronically Signed   AE/MedQ  DD: 12/05/2006  DT: 12/06/2006  Job #: [id number]

## 2010-09-22 NOTE — Assessment & Plan Note (Signed)
[facility]                            CARDIOLOGY OFFICE NOTE   SAEL, FURCHES                       MRN:          [id number]  DATE:02/20/2008                            DOB:          Jan 17, 1941    Mr. Lierman is here for followup.  He has a multitude of problems but  today's major issue is follow up of dizziness and his blood pressure.  When I saw him last, he was relatively stable and we decided not to  change his medications.  Since that time, he has felt well.  He had 1  episode of dizziness when turning his head that sounds more like  vertigo.  He had some slight dizziness from standing.  He reported his  blood pressures at home and they range from 130 systolic to 160  systolic.  Here in the office today, his systolic is 130.  He has not  had chest pain, syncope, or presyncope.   PAST MEDICAL HISTORY:   ALLERGIES:  Augmentin, cortisone, and Levaquin.   MEDICATIONS:  They are listed in the flow sheet.   REVIEW OF SYSTEMS:  No fevers or chills.  He has no arthralgias.  There  is no GI or GU problems.   PHYSICAL EXAMINATION:  GENERAL:  The patient is oriented to person,  time, and place.  Affect is normal.  HEENT:  No xanthelasma.  He has normal extraocular motion.  He has a  right carotid bruit.  There is no jugular venous distention.  LUNGS:  Clear.  Respiratory effort is not labored.  CARDIAC:  S1 and S2.  There are no clicks or significant murmurs.  ABDOMEN:  Soft.  EXTREMITIES:  He has no peripheral edema.   Problems are listed completely on the note of June 05, 2007.  #8.  Carotid artery disease.  He has follow up studies scheduled.  #17.  Volume overload.  This is stable.  #21.  Mild bradycardia, stable.  #22.  Episodes of dizziness.  This is stable on the current medications.  I feel it is prudent not to change any of the medicines at this time.  I  will see him back in 3 months.     [name], MD, [facility]  Electronically Signed    JDK/MedQ  DD: 02/20/2008  DT: 02/21/2008  Job #: [id number]

## 2010-09-22 NOTE — Assessment & Plan Note (Signed)
[facility]                            CARDIOLOGY OFFICE NOTE   [name], [name]                       MRN:          [id number]  DATE:03/07/2007                            DOB:          Sep 12, 1940    The patient is doing well from a cardiac viewpoint.  He is not having  any chest pain.  Dr. Debby Bud has nicely adjusted his medications.  His  blood pressure is under good control.  He tells me that early in the  morning after he takes all of his medicines, he has a period of feeling  somewhat weak and dizzy.  It may be that all of his medicines in the  morning are not optimal for him.  I have encouraged him to hold his  morning dose of Hydralazine and spread his other two doses out later in  the day and then I will see him for follow-up to make sure he is stable.  His volume status remains stable.   ALLERGIES:  The patient should not take NONSTEROIDAL ANTI-INFLAMMATORY  AGENTS.  Other allergies include question of CORTISONE and question of  AUGMENTIN.  There has been question in the past of allopurinol allergy,  we have no documentation of this.  He will be allowed to take his  allopurinol.   MEDICATIONS:  1. Clonidine.  2. Carvedilol.  3. Potassium.  4. Aspirin.  5. Isosorbide.  6. Colchicine.  7. Anoxine.  8. Hydralazine 25 mg t.i.d. (to be changed to b.i.d.).  9. Felodipine 20 mg.  10.Lasix 40 mg b.i.d.   PAST MEDICAL HISTORY:  See the extensive list on the note of November 08, 2006.   REVIEW OF SYSTEMS:  Today he mentions the dizziness as outlined above in  the HPI and we will adjust his medicines for this.  He asked if he is  allowed to take Tylenol and I made it clear to him that he can take  Tylenol, but he cannot take the other medicines that are characterized  as nonsteroidal anti-inflammatory agents.  We discussed the issue of  allergy to allopurinol for which there is no definite proof and he will  be able to be restarted on his  allopurinol as written by Dr. Debby Bud.  He  also mentioned that he gets a stinging sensation on the top of his right  foot at times.  This then goes away. Otherwise his review of systems is  negative.   PHYSICAL EXAMINATION:  VITAL SIGNS:  Weight is 251 pounds, blood  pressure 138/72, pulse 49.  GENERAL:  The patient is oriented to person, time, and place.  Affect is  normal.  HEENT:  No xanthelasma.  He has normal extraocular motion.  There are no  carotid bruits.  There is no jugular venous distention.  LUNGS:  Clear.  Respiratory effort is not labored.  HEART:  S1 with S2.  There are no clicks or significant murmurs.  ABDOMEN:  Soft.  He has no significant peripheral edema.  EXTREMITIES:  There is no significant abnormality in the right foot.  The pulse is  good.   LABORATORY DATA:  No labs are done today.   PROBLEM LIST:  Problems are listed extensively on the note of November 08, 2006.  #7.  Gout.  He does respond to Colchicine.  It is now time to restart  his allopurinol and this will be done.  He is not allergic to  allopurinol that we know.  #8.  Carotid disease.  His last Doppler was in June of 2008.  There is a  six month scheduled follow-up and we will be sure that this is in place.  #16.  Some morning dizziness.  We will hold his morning hydralazine  dose.  #19.  Volume overload.  He is watching this carefully and his volume  status is stable.  #20.  Some discomfort in his right foot intermittently.  This does not  appear to be a significant vascular problem.   We will adjust his medicines as outlined above and I will see him for  follow-up.     [name], MD, [facility]  Electronically Signed    JDK/MedQ  DD: 03/07/2007  DT: 03/07/2007  Job #: [id number]   cc:   Rosalyn Gess. Norins, MD

## 2010-09-22 NOTE — Assessment & Plan Note (Signed)
[facility]                            CARDIOLOGY OFFICE NOTE   [name], [name]                       MRN:          [id number]  DATE:11/08/2006                            DOB:          Jan 28, 1941    From the cardiac viewpoint, [name] is actually stable.  He has had a  cough, but I believe that this may be a mild viral illness from which he  is recovering.  I do not think it is from heart failure.  He was seen in  the emergency room on October 28, 2006.  It appears that most likely he was  having some gout symptoms.  BUN, when checked, was 19.  Hemoglobin 12.2,  creatinine 1.6.  He was given colchicine and a prescription for  allopurinol.  He started the colchicine and has not taken any  allopurinol, and he is here for followup.  He did have some swelling in  his feet associated with the gout.  He did not appear to be volume  overloaded.  His weight did not change.  His feet are feeling better and  they are back to baseline.   PAST MEDICAL HISTORY:   ALLERGIES:  Question of a CORTISONE allergy.  AUGMENTIN.   MEDICATIONS:  1. Clonidine.  2. Carvedilol.  3. Potassium.  4. Aspirin.  5. Isosorbide.  6. Digitek.  7. Hydralazine.  8. Nexium.  9. Colchicine as needed.  10.Felodipine.  11.Lasix.   OTHER MEDICAL PROBLEMS:  See the list below.   REVIEW OF SYSTEMS:  He has no complaints other than those issues  mentioned in the HPI.   PHYSICAL EXAM:  Blood pressure is 137/76 with a pulse of 62.  The patient is oriented to person, time, and place.  Affect is normal.  HEENT:  Reveals no xanthelasma.  He has normal extraocular motions.  He  does have soft carotid bruits.  There is no jugular venous distension.  LUNGS:  Clear.  Respiratory effort is not labored.  CARDIAC:  Reveals an S1 with an S2.  He has no clicks or significant  murmurs.  ABDOMEN:  Soft.  He has normal bowel sounds.  He does have trace  peripheral edema, but this appears to  be his baseline.  There are no  major musculoskeletal deformities.   EKG reveals sinus bradycardia with old inferior infarct.   PROBLEMS:  1. Coronary artery disease with coronary artery bypass grafting in      1993 and catheterization in 2006 revealing new occlusion of the      vein graft that probably occurred before that admission.  2. Decreased left ventricular function.  Meds have been titrated.      Ejection fraction is in the 40% range.  We have decided that he is      not a candidate at this point for implantable cardioverter      defibrillator placement.  3. History of creatinine in the 1.5 to 1.6 range.  He was evaluated      fully by Dr. Elvis [name] in the past.  He had a very  bad response      with worsening renal function from nonsteroidal antiinflammatory      meds and these are no longer used.  4. History of a brain tumor that was removed successfully in the past.  5. Intolerance to Niaspan.  6. Bilateral thigh discomfort in the past that is not vascular.  7. Gout.  He responds well to colchicine and we will continue this.      He and Dr. Debby [name] can decide over time if allopurinol should be      used.  8. The patient has been on allopurinol in the past, but not currently.  9. Carotid disease.  He has been seen by Dr. Madilyn [name] in the past.  Most      recent Doppler was on November 02, 2006 revealing 0-39% on the right      and 60-79% on the left, and this is stable and needs follow up in 6      months.  10.Cholesterol elevation.  He has not tolerated meds in the past.  11.History of a significant melanoma that was removed at [facility] in the      past.  12.History of some bronchitis.  He currently has an intermittent      cough, and I suspect that this is possibly a recent viral illness      with some chronic changes.  13.History of some mild dilatation of the aortic root, and this will      be assessed when we consider followup echo in the future.  14.History of back pain that  is musculoskeletal.  15.History of renal and liver cysts seen on CT in the past.  16.History of some dizziness that has been stable.  17.Reaction to Augmentin with swelling of the tongue.  18.History of a guaiac positive stool over time.  This was worked up      and he is stable.  19.Volume overload.  He is now very carefully paying attention to his      weight and his meds, and he is stable.  20.Question of an abdominal bruit in the past.  He has had an      ultrasound with no evidence of an abnormality of his aorta, and no      evidence of renal artery stenosis.  21.Difficulty with dealing with the sorrow of his wife's developing      severe Alzheimer's.   Cardiac status is stable.  I will see him back in 6 months.     [name], MD, [name]  Electronically Signed   JDK/MedQ  DD: 11/08/2006  DT: 11/08/2006  Job #: [id number]   cc:   [name] Gess. Norins, MD

## 2010-09-22 NOTE — H&P (Signed)
[name], [name]                ACCOUNT NO.:  [id number]   MEDICAL RECORD NO.:  [id number]          PATIENT TYPE:  OBV   LOCATION:  3735                         FACILITY:  [facility]   PHYSICIAN:  Valerie A. Felicity Coyer, MDDATE OF BIRTH:  21-Mar-1941   DATE OF ADMISSION:  05/24/2007  DATE OF DISCHARGE:                              HISTORY & PHYSICAL   PRIMARY CARE PHYSICIAN:  Rosalyn Gess. Norins, MD.   CARDIOLOGIST:  [name], MD.   CHIEF COMPLAINT:  Elevated blood pressure x3 days.   HISTORY OF PRESENT ILLNESS:  The patient is a 70 year old white  gentleman with significant cardiac history and known hypertension, who  comes to the [facility] emergency room today with 3 days of uncontrolled  high blood pressure taken at home.  This is associated with an episode  of weakness last p.m. and fatigue.  He reports that he had slept all  afternoon yesterday, which is unusual for him, and then during the  evening awoke with a generalized feeling of weakness and discomfort.  He  reports left upper extremity weakness and discomfort with movement,  which resolved and did not feel like previous cardiac pain.  He had no  disturbance of his gait, difficulty speaking, difficulty with movement  or change in vision.  There was no nausea, vomiting, no dyspnea on  exertion.  No recent chest pain, shortness of breath, headache, vision  changes.  No recent fever or ill contacts.  No change in his lower  extremity swelling.  Because of the uncontrolled blood pressure, he came  to the ER for further evaluation and advice.  The ER now refers him for  admission due to lack of change after being given clonidine in his  pressure associated with persistent bradycardia sinus in the 40s.   PAST MEDICAL HISTORY:  1. Coronary artery disease, status post CABG, 1993.  2. Ischemic cardiomyopathy with an LVEF of 35-40% by echo September      2008.  3. Hypertension.  4. Chronic renal insufficiency with baseline  creatinine of 1.6.  5. Dyslipidemia.  6. History of gout.  7. GERD.  8. History of brain tumor, status post remote resection.  9. History of carotid disease, followed annually by Dr. Madilyn Fireman.  10.History of melanoma, remote.  11.Osteoarthritis.   CURRENT MEDICATIONS:  1. Clonidine 0.1 mg b.i.d.  2. Coreg 25 mg b.i.d.  3. Potassium chloride 40 mEq b.i.d.  4. Aspirin 81 mg daily.  5. Isordil 10 mg t.i.d.  6. Digoxin 0.25 mg daily.  7. Colchicine 0.6 mg p.r.n.  8. Lasix 40 mg b.i.d.  9. Nexium 40 mg daily,  10.Hydralazine 50 mg b.i.d. taken midday and bedtime.  11.Allopurinol 300 mg daily.  12.Felodipine 10 mg daily.   ALLERGIES:  AUGMENTIN, which causes tongue swelling in delirium.  Also  question allergy to CORTISONE and ANTI-INFLAMMATORIES.   FAMILY HISTORY:  Reviewed noncontributory to this admission.   SOCIAL HISTORY:  He lives with his wife of 48 years, who was suffering  from dementia.  He is retired from the school system.  Remote tobacco  abuse and no alcohol use.  Independently active in daily living to  assist with supervision of his wife's care.   REVIEW OF SYSTEMS:  Please see HPI as stated, otherwise reviewed and  negative except as listed.   PHYSICAL EXAM:  Temperature of 97.1, blood pressure 175/80, pulse of 43,  respirations 18, saturating 100% on room air.  GENERAL:  He is a pleasant, heavy-set man, who is in no acute distress,  awake, alert, oriented x4, with his wife at bedside.  HEENT:  Unremarkable.  He is normocephalic, atraumatic.  PERRL.  Mucous  membranes are moist and there is no scleral icterus or injection.  NECK:  Thick but supple with full range of motion.  No appreciable JVD,  carotid bruit or lymphadenopathy.  CHEST:  Clear to auscultation bilaterally without wheeze,rhonchi or  crackles.  No increased work of breathing or distress.  CARDIOVASCULAR:  Regular rate and rhythm but slightly bradycardic.  Normal S1 and S2.  No appreciable  murmurs.  ABDOMEN:  Obese but nontender, nondistended, with good bowel sounds.  No  rebound to palpation.  EXTREMITIES:  No swelling or edema to the bilateral lower extremities.  No rashes or varicosities.  NEUROLOGIC:  He is awake, alert and oriented x4, and no gross motor or  cognitive or sensory deficits appreciated.   LABORATORY DATA:  Normal CBC.  I-STAT 8 with a potassium of 3.4 and a  random glucose of 144.  Digoxin of 0.6, which is subtherapeutic, and a  BNP of 312.  Chest x-ray shows stable cardiomyopathy and no evidence of  heart failure, airspace disease or effusion.  EKG shows no acute  findings, bradycardic at 51 beats per minute with PVCs, first-degree AV  block with PR interval of 0.26.   ASSESSMENT/PLAN:  1. Bradycardia with uncontrolled hypertension.  We will admit at this      time for further observation and treatment.  Patient without acute      ischemic changes on EKG, and point of care enzymes are negative.      Digoxin level is subtherapeutic and unlikely to be cause of      bradycardia.  There are no signs or symptoms of a neurologic event.      Will hold Coreg at this time though noting the patient's      cardiomyopathy, likely need some dose of beta blocker.  We will re-      determine need after resolution of bradycardia.  Will increase      Isordil to a higher dose of extended-release Imdur beginning in the      morning, please see orders.  We will continue other medications as      before and include p.r.n. clonidine for systolic pressure greater      than 160.  Check TSH, cycle cardiac enzymes and monitor on      telemetry.  Plan for discharge in the a.m. if blood pressure      improved with asymptomatic bradycardia.  Consider cardiology      evaluation inpatient if unable to discharge home and/or outpatient      follow-up closely following discharge.  2. Hyperglycemia.  No previous history of record on MD outpatient      visits.  Had checked a glucose  tolerance test, noting previous      question of diabetes.  The patient denies this.  Will check A1c and      check basic metabolic for further elevation random glucose at this  time.  Further outpatient follow-up on treatment to be determined.  3. Chronic renal insufficiency.  I-STAT 8 creatinine of 1.6, is at      baseline.  No changes.  Continue home medications.  4. Chronic systolic heart failure, compensated on exam and euvolemic.      No plans for repeat echo on this admit given recent studies and no      evidence of decompensation.  5. Dyslipidemia.  Not currently on medications at this time.  Plan for      check of fasting lipids.  6. History of gout.  Continue home allopurinol.  Currently stable.  7. Gastroesophageal reflux disease.  Continue home PPI.   DISPOSITION:  Plan discharge home in the a.m. pending further evaluation  and results of above.  Please see admission orders for further details.      Valerie A. Felicity Coyer, MD  Electronically Signed     VAL/MEDQ  D:  05/24/2007  T:  05/25/2007  Job:  [id number]

## 2010-09-22 NOTE — Assessment & Plan Note (Signed)
[name]                            CARDIOLOGY OFFICE NOTE   [name], [name]                       MRN:          [id number]  DATE:03/30/2007                            DOB:          Nov 11, 1940    [name] is doing well.  I saw him last on March 07, 2007.  We had  talked about the fact that, when he takes his medicines in the morning,  he can feel somewhat weak and dizzy.  We moved his hydralazine dose from  the morning to mid day.  Therefore, after very careful review by  everyone and further discussion with him, he is actually taking  hydralazine 50 twice a day, dosing at mid day and at night.  He is doing  well with this.  I had written last time that we would restart his  allopurinol, but this was not done.  It will be started today at a low  dose.  Overall, he is doing well and I am pleased with his overall  status.   PAST MEDICAL HISTORY:   ALLERGIES:  He should not take NON-STEROIDAL ANTI-INFLAMMATORY AGENTS.  There is a question to allergy to CORTISONE and AUGMENTIN.  There is no  documentation of allopurinol allergy.   MEDICATIONS:  1. Clonidine 0.1 b.i.d.  2. Carvedilol 25 b.i.d.  3. Potassium 40 b.i.d.  4. Aspirin 81.  5. Isosorbide 10 t.i.d.  6. Colchicine p.r.n.  7. Lanoxin 0.25.  8. Lasix 40 b.i.d.  9. Nexium 40.  10.Hydralazine 50 mid day and at night.  11.Felodipine 10.   OTHER MEDICAL PROBLEMS:  See the extensive list on the note of November 08, 2006 that is completely reviewed.   REVIEW OF SYSTEMS:  See the HPI.  Otherwise, his review of systems is  negative.   PHYSICAL EXAM:  Weight is 253.  This is up 1 pound since his last visit.  Blood pressure is 140/78 with a pulse of 52.  The patient is oriented to person, time, and place.  Affect is normal.  HEENT:  Reveals no xanthelasma.  He has normal extraocular motions.  There are no carotid bruits.  There is no jugular venous distension.  LUNGS:  Clear.   Respiratory effort is not labored.  CARDIAC:  Reveals an S1 with an S2.  There are no clicks or significant  murmurs.  ABDOMEN:  Soft.  He has trace peripheral edema.   [name] is doing well.  His problems are listed extensively on the  note of November 08, 2006.   PROBLEM LIST:  1. Gout.  We will definitely start him today on allopurinol 100 mg      daily.  2. Carotid disease.  He is scheduled to have a followup Doppler in      December and I will see if I can see him for followup on that same      day.  3. Morning dizziness.  This is better as we have adjusted the      medicines as outlined.  4. Volume overload.  He is stable with  this.   I will see him for followup.  We will add allopurinol.  No other  medication changes.     [name] Abed, MD, [place]  Electronically Signed    JDK/MedQ  DD: 03/30/2007  DT: 03/30/2007  Job #: [id number]

## 2010-09-22 NOTE — Assessment & Plan Note (Signed)
[facility]                            CARDIOLOGY OFFICE NOTE   [name], [name]                       MRN:          [id number]  DATE:05/15/2008                            DOB:          1941/01/27    [name] is doing well.  He has many medical problems that are outlined  on prior notes including an EMR in the note of June 05, 2007.  Recently he has been stable.  He does have carotid disease and his  carotids are followed very carefully.  He has had some volume overload,  but this is stable.  He has had episodes of dizziness.  Recently, he had  some when turning his head rapidly.  This may have been inner ear.  Overall, he is stable.   His volume status is stable at this point.   PAST MEDICAL HISTORY:   ALLERGIES:  See the flow sheet.   MEDICATIONS:  See the flow sheet.   REVIEW OF SYSTEMS:  He is not having any GI or GU symptoms.  He has no  fevers, chills, headaches, or skin rashes.  Otherwise, his review of  systems is negative.   PHYSICAL EXAMINATION:  VITAL SIGNS:  Blood pressure is 148/71 with the  pulse of 48.  GENERAL:  The patient is oriented to person, time, and place.  Affect is  normal.  HEENT:  No xanthelasma.  He has normal extraocular motion.  There are no  carotid bruits.  There is no jugular venous distention.  LUNGS:  Clear.  Respiratory effort is not labored.  CARDIAC:  S1 with an S2.  There are no clicks or significant murmurs.  ABDOMEN:  Soft.  EXTREMITIES:  He has no significant peripheral edema.   Problems are listed in EMR and on the prior notes.  His volume status is  stable.  He does have dizziness, but I believe this is not cardiac.  There will be no change in his meds.  I will see him back in 6 months  for Cardiology followup.     [name], MD, [facility]  Electronically Signed    JDK/MedQ  DD: 05/15/2008  DT: 05/15/2008  Job #: [id number]

## 2010-09-22 NOTE — Assessment & Plan Note (Signed)
[name]                            CARDIOLOGY OFFICE NOTE   [name], [name]                       MRN:          [id number]  DATE:06/09/2007                            DOB:          09-12-1940    [name] returns today for follow-up.  See my extensive note of June 05, 2007.  At that time I was concerned that he was feeling poorly in  general.  We adjusted his clonidine does back and we changed his nitrate  dosing in the night time.  Overall he feels much better.  His blood  pressure was actually on the lower side this morning.  This may be  related to the adjustments in his meds.   He has a new problem with some discomfort in his right elbow.  I have  examined it carefully.  I do not think it is ischemic.   PAST MEDICAL HISTORY:   ALLERGIES:  AUGMENTIN, CORTISONE.   MEDICATIONS:  See the prior note.   OTHER:  Medical problems, see the complete list on the note of June 05, 2007.   REVIEW OF SYSTEMS:  Other than the HPI.  His review of systems is  negative.   PHYSICAL EXAM:  Blood pressure today is 117/66 with a pulse of 53.  The patient is oriented to person, time and place.  Affect is normal.  HEENT:  Reveals no xanthelasma.  Normal extraocular motion.  Lungs are clear.  Respiratory effort is not labored.  Cardiac exam reveals S1-S2.  There are no clicks or significant murmurs.  There is no swelling in his right elbow.  There is some slight  discomfort to pressure on the outer part of the elbow.  There is no feet  and there is no redness.    Abdomen is soft.  He has no peripheral edema.   No labs were done today.   PROBLEMS:  Listed on the note of June 05, 2007 and they include  problems number 1-21.  #22.  Discomfort in the right elbow.  At this point I believe that this  is not from any type of bleeding.  It is also not from ischemia.  I  wonder if this is a type of gouty symptom.  He is on allopurinol.  While  on  allopurinol, he has held his colchicine.  I have encouraged him to  keep an eye on this discomfort and also tried taking 1 to 2 colchicine  today for few days to see how he feels.  Otherwise no other change.   Considering his overall status of believe that he is improved since  June 05, 2007 and we will leave the medicine changes in place that  were made at that time.  I will see him back in approximately 4 weeks  for follow-up.     [name], MD, [facility]  Electronically Signed    JDK/MedQ  DD: 06/09/2007  DT: 06/09/2007  Job #: [id number]

## 2010-09-25 ENCOUNTER — Ambulatory Visit: Payer: Medicare Other | Admitting: Cardiology

## 2010-09-25 NOTE — Cardiovascular Report (Signed)
[place]. [facility]  Patient:    [name], [name]                       MRN: [id number] Proc. Date: 04/06/00 Adm. Date:  [id number] Attending:  Alric Quan CC:         Cardiac Catheterization Laboratory  Earl Many, M.D.   Cardiac Catheterization  PROCEDURE:  Cardiac catheterization.  CARDIOLOGISTEverardo Beals Juanda Chance, M.D.  INDICATIONS:  The patient is 70 years old and had bypass surgery in 1993.  He had severe left ventricular dysfunction with an ejection fraction of 25% preoperatively.  He was studied again in 1998, and was found to have a total occlusion of the vein graft to the diagonal branch of the LAD, and severely diseased vein graft to the right coronary artery which was a small vessel.  He was admitted to [facility] with chest and left shoulder pain, and was seen by Dr. Earl Many, and was transferred to Dr. [name] last night, and scheduled for a cardiac catheterization this morning.  DESCRIPTION OF PROCEDURE:  The procedure was performed via the right femoral artery and arterial sheath and 6-French preformed coronary catheters.  A frontal arterial puncture was performed, and Omnipaque contrast was used.  A LIMA catheter was used for injection of the LIMA graft.  A JR4 catheter was used for injection of the vein grafts.  A distal aortogram was performed to rule out an abdominal aortic aneurysm.  RESULTS: 1. Left main coronary artery:  The left main coronary artery was free of    significant disease. 2. Left anterior descending coronary artery:  The left anterior descending    artery gave rise to three large septals and a diagonal branch, and then    was completely occluded.  The diagonal branch which was a large vessel    in the preoperative study was diffusely diseased in its proximal segment    with focal 95% narrowing.  This has not changed too much from the study    in 1998. 3. Circumflex coronary  artery:  The circumflex artery was completely    occluded at its origin. 4. Right coronary artery:  The right coronary artery was completely occluded    near its origin. 5. Saphenous vein graft to the marginal and posterolateral branch of the    circumflex artery was patent, and there appeared to be an extension    to a posterolateral branch of the right coronary artery, which also    filled a posterior descending branch.  This vein graft was free of    significant disease.  There was another posterolateral branch which    was not grafted, which filled via collaterals. 6. Saphenous vein graft to the right coronary artery was completely occluded,    but this was a very small vessel by prior study. 7. Saphenous vein graft to the diagonal branch was completely occluded,    and this was old. 8. The left internal mammary artery graft to the left anterior descending    coronary artery was patent, and the distal left anterior descending    coronary artery was free of major obstruction.  The distal left anterior    descending coronary artery also filled the posterolateral branch of the    circumflex coronary artery which was not grafted.  LEFT VENTRICULOGRAM:  The left ventriculogram performed in the RAO projection showed mild global hypokinesis with an estimated ejection  fraction of 50%. This ejection fraction was markedly improved from its preoperative ejection fraction which was estimated at 25%.  DISTAL AORTOGRAM:  A distal aortogram was performed which showed irregularities in the distal aortic and iliac systems, but no major obstruction.  The left renal artery was patent.  There appeared to be dual right renal arteries, and the inferior one was patent, and the superior one was not well visualized.  CONCLUSION:  Coronary artery disease, status post coronary artery bypass graft surgery in 1993, with a total occlusion of the left anterior descending coronary artery, segmentally diseased  diagonal branch, with 95% proximal stenosis, total occlusion of the circumflex artery, and total occlusion of the right coronary artery, with a patent sequential vein graft to the marginal and posterolateral branch of the circumflex artery, and posterolateral branch of the right coronary artery, patent left internal mammary artery graft to the left anterior descending coronary artery, and occluded vein graft to the diagonal branch of the left anterior descending coronary artery, and occluded vein graft to the right coronary artery, and mild global hypokinesis.  RECOMMENDATIONS:  Since the last study the patient has occluded the vein graft to the right coronary artery, but this was severely diseased before, and this is a small vessel.  I am not certain if his recent symptoms were ischemic or not.  He does have a potential source of ischemia in the diagonal branch of the left anterior descending coronary artery, as well as a small branch of the right coronary artery, to which the graft is occluded.  I would recommend medical therapy.  If he has recurrent angina that is not controlled with medical therapy, and if he has a scan that lateralizes to the diagonal distribution, we could consider intervention on the diagonal branch, but the vessel is not particularly favorable for intervention.  I discussed these findings with Dr. Sherril Croon, and will discharge the patient today with followup with Dr. Sherril Croon within one week.  ADDENDUM PRESSURES: Aortic pressure:  189/79. Left ventricular pressure:  189/30. DD:  04/06/00 TD:  04/06/00 Job: [id number]

## 2010-09-25 NOTE — Assessment & Plan Note (Signed)
[name]                              CARDIOLOGY OFFICE NOTE   [name], [name]                       MRN:          [id number]  DATE:03/14/2006                            DOB:          1940/10/08    HISTORY:  [name] is doing very well.  See my extensive note of February 03, 2006.  Prior to that visit he had felt short of breath and thought he  might be developing some heart failure.  He was hospitalized and stabilized  and actually did quite well without any recurrent problem.  We reviewed all  of his issues and medicines at the time of his last visit.  We also talked  about him taking some extra Lasix on a p.r.n. basis.   Since that time he has done very well. He is not having any chest pain or  shortness of breath.  He is going about full activities and, in fact, he is  more active than when I saw him last visit.  He is feeling well and has not  had syncope or presyncope.   PAST MEDICAL HISTORY:  For other medical problems, see the extensive list on  my note of February 03, 2006 that is completely reviewed.   ALLERGIES:  Question of ALLERGY to AUGMENTIN and CORTISONE.   MEDICATIONS:  1. Digitek 0.125.  2. Coreg 12.5 b.i.d.  3. Potassium 40 mEq b.i.d.  4. Lasix 40 and sometimes 60 mg b.i.d.  5. Nexium 40 mg  6. Plendil 10.  7. Hydralazine 25 t.i.d.  8. Colchicine.  9. Aspirin 81 mg.  10.Imdur.   REVIEW OF SYSTEMS:  He feels great today and has no complaints.  His review  of systems is negative.   PHYSICAL EXAMINATION:  VITAL SIGNS:  His weight today is 253.  This is  decreased 10 pounds from when I saw him in September.  Blood pressure 130/76  with pulse of 58.  GENERAL APPEARANCE:  Patient is oriented to person, time and place and his  affect is normal.  HEENT:  Reveals no xanthelasma.  He has normal extraocular motion.  NECK:  There are no carotid bruits.  There is no jugular venous distention.  LUNGS:  Clear.   Respiratory effort is not labored.  CARDIAC:  Exam reveals an S1 and S2.  There are no clicks or significant  murmurs.  ABDOMEN:  Soft.  There are no masses or bruits.  There are normal bowel  sounds.  EXTREMITIES:  The patient has trace peripheral edema.  MUSCULOSKELETAL:  There are no musculoskeletal deformities.   PROBLEM LIST:  Problems are listed extensively on my note of February 03, 2006.  1. Coronary disease, stable.  2. Decreased left ventricular function.  As he continues to stabilize, we      will consider a repeat echocardiogram.  I believe that we should not      place an ICD at this time.  3. Volume status:  He is stable.   PLAN:  No change in his therapy.  I will see him back  in six months.    ______________________________  [name], MD, [id number] [facility]    JDK/MedQ  DD: 03/14/2006  DT: 03/14/2006  Job #: [id number]

## 2010-09-25 NOTE — Assessment & Plan Note (Signed)
[facility]                            CARDIOLOGY OFFICE NOTE   LESTAT, GOLOB                       MRN:          [id number]  DATE:05/18/2006                            DOB:          1941-03-30    [name] is seen for cardiology followup.  I saw him last on March 14, 2006.  At that time, he actually was doing relatively well.  More  recently he had a spell of abdominal bloating, but also some numbness  and tingling in his left arm and dizziness.  Also some difficulty with  the use of his left leg that lasted for 10 minutes with a headache,  which then improved.  He was assessed in the [facility] emergency room.  His blood pressure at that time was 213/101.  He received morphine and  Ativan and improved.  There was some type of evaluation of his abdomen  and there is question of  a 4 cm abdominal aortic aneurysm, but I do not  have that report.  We have done followup carotid Dopplers, showing no  change in his carotid disease which was outlined below.  Since that time  he has seen Dr. Yancey Flemings in followup and a colonoscopy and endoscopy  have been arranged.  This is to be done later this month.   The patient mentions that he has had a new finding of feeling coolness  in his feet after he takes his socks off.  The exact etiology is not  clear.  He is not having any pain.  He is not having any chest pain.  He  is not having any marked shortness of breath.   PAST MEDICAL HISTORY:   ALLERGIES:  QUESTION CORTISONE.   MEDICATIONS:  1. Clonidine 0.1 b.i.d.  2. Carvedilol 25 b.i.d.  3. Potassium 40 mEq b.i.d.  4. Famotidine 20 b.i.d.  5. Aspirin.  6. Isosorbide.  7. Digitek.  8. Hydralazine.  9. Felodipine.  10.Nexium.  11.Lasix 40 b.i.d.  12.Colchicine.   OTHER MEDICAL PROBLEMS:  See the extensive list on my note of February 03, 2006.   REVIEW OF SYSTEMS:  Today he feels well.  He is not having any  significant problems and his  review of systems, otherwise, is negative.  His gout medication (colchicine) is working better.  His blood pressure  is remaining higher than usual.  Otherwise, his review of systems is  negative.   PHYSICAL EXAMINATION:  Blood pressure is 155/84 with a pulse of 48.  The patient is oriented to person, time, and place, and his affect is  normal.  He is here with his wife today.  There is no xanthelasma.  There is normal extraocular motion.  I do not hear a carotid bruit.  There is no jugular venous distension.  Lungs are clear.  Respiratory effort is not labored.  CARDIAC EXAM:  Reveals an S2 with an S2.  There are no clicks or  significant murmurs.  His abdomen is soft.  He has no masses or bruits.  He has trace peripheral edema today.  He  has decreased distal pulses,  but his feet are warm.  Capillary refill appears appropriate.   Carotid Doppler was done on May 04, 2006.  It shows no significant  change.  He has carotid stenosis of 60%-79% on the left and 0%-39% on  the right.  The percent stenoses have not changed.  I do not know if the  velocities have changed.  There is also of note that I have reviewed old  abdominal ultrasounds and no dilatation of his abdominal aorta has been  noted in the past.   Problems are listed extensively on my note of February 03, 2006.  Referring to the numbers in that problem list:  1. Coronary disease.  Stable.  2. Decreased left ventricular function.  He is on appropriate      medication.  3. History of creatinine that was elevated, and he was evaluated in      the past by Dr. Elvis Coil, and after he was taken off      nonsteroidals he improved.  4. Gout.  He does well with colchicine.  5. Carotid disease.  See the Doppler data, and it has not changed      significantly.  6. Episodes of volume overload.  This appears to be stable.  However,      with recent possibility of change in his blood pressure, we will      proceed with renal  artery Dopplers to be sure he does not have      renal artery stenosis.  7. Question of abdominal bruit.  I had done ultrasound in the past      with no renal artery stenosis and a normal caliber aorta.  8. Recent worsening blood pressure.  He will have renal artery      Doppler.  9. Recent abdominal bloating.  He is seeing Dr. Marina Goodell for this.  Also,      history of kidney stones, and he sees Dr. Darvin Neighbours yearly.     [name], MD, [facility]  Electronically Signed    JDK/MedQ  DD: 05/18/2006  DT: 05/18/2006  Job #: [id number]   cc:   Windy Fast L. Earlene Plater, M.D.  Wilhemina Bonito. Marina Goodell, MD

## 2010-09-25 NOTE — Discharge Summary (Signed)
[place]. [facility]  Patient:    [name], [name]                       MRN: [id number] Proc. Date: 04/05/00 Adm. Date:  [id number] Disc. Date: 04/06/00 Attending:  Alric Quan Dictator:   Tereso Newcomer, P.A. CC:         Dario Ave, M.D.  Earl Many, M.D.   Discharge Summary  DATE OF BIRTH:  1940/06/24  DISCHARGE DIAGNOSES: 1. Coronary artery disease. 2. Hypertension. 3. Chronic obstructive pulmonary disease. 4. Ejection fraction 50%. 5. History of brain tumor status post successful surgery. 6. Coronary artery bypass grafting in 1993, LIMA to the LAD, SVG to the    diagonal, SVG to the OM and circumflex, SVG to RCA. 7. Cardiolite in September 2000 with apical scar, questionable lateral    ischemia, EF 41%.  REASON FOR ADMISSION:  Chest pain.  PROCEDURE PERFORMED:  Cardiac catheterization by Dr. Charlies Constable on April 06, 2000.  LAD was total after a diagonal 1, 95% diffuse circumflex and RCA total.  SVG to OM and PL of the circumflex and PL of the RCA okay. SVG to the RCA total new.  SVG to the diagonal total, old.  LIMA to LAD okay. Left ventricular mild global hypokinesis.  EF 50%. SVG to the RCA was severely diseased in 1998, now totally occluded but small vessel.  The diagonal could be culprit, but is not a favorable vessel for PCI.  Therefore, medical therapy planned.  Please see Dr. Delia Chimes full dictated note for complete details.  ADMISSION HISTORY:  This 70 year old male was sent from [facility] for catheterization.  He had had two hours of chest pain.  His EKG was negative. His enzymes were negative.  INITIAL PHYSICAL EXAMINATION:  CARDIAC:  S1 and S2.  Systolic murmur.  NECK:  Within normal limits.  LUNGS:  Clear.  ABDOMEN:  Soft.  EXTREMITIES:  No edema.  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 Chest x-ray okay.  Labs done at [facility] showed sodium 143, potassium 3.5,  chloride 106, CO2 26, glucose 114, BUN 24, creatinine 1.3, albumin 3.8, total protein 7.3, total bilirubin 0.57, AST 20, ALT 25, alkaline phosphatase 59.  Total CK #1 50, troponin I 0.  Total CK #2 41, troponin I 0.  Digoxin 0.50.  White count 12,100, hemoglobin 15.6, hematocrit 47.2, platelet count 228,000, INR 1.0. Total CK #3 39, troponin I 0.1.  HOSPITAL COURSE:  The patient was admitted to [facility] and set up for catheterization.  The procedure was performed on April 06, 2000 by Dr. Charlies Constable.  There were no immediate complications.  The patient tolerated the procedure well.  It was decided to continue the patient on medical therapy.  He was found to be in stable condition on the afternoon of March 29, 2000 and discharged to home.  DISCHARGE MEDICATIONS: 1. Lotensin 40 mg q.d. 2. Diovan 80 mg q.d. 3. Lanoxin 0.25 mg q.d. 4. Norvasc 10 mg q.d. 5. Imdur 60 mg q.d. 6. Lasix 40 mg, one q.a.m. and 1/2 p.o. q.p.m. 7. Atenolol 25 mg q.d. 8. Nitroglycerin 0.4 mg sublingual p.r.n. chest pain. 9. Aspirin 325 mg q.d.  ACTIVITY:  No driving, heaving lifting or exertion for three days.  DIET:  Low fat, low salt diet.  WOUND CARE:  The patient is to watch his groin for increased swelling, bleeding or bruising and call us  for concerns.  FOLLOW UP:  With [name], M.D. in [place] on April 11, 2000 at 4:45 p.m. DD:  04/06/00 TD:  04/06/00 Job: [id number]

## 2010-09-25 NOTE — Consult Note (Signed)
[name], [name] NO.:  [id number]   MEDICAL RECORD NO.:  [id number]          PATIENT TYPE:  INP   LOCATION:  3742                         FACILITY:  [facility]   PHYSICIAN:  Maple Mirza, P.A. DATE OF BIRTH:  [birth year]   DATE OF CONSULTATION:  05/25/2004  DATE OF DISCHARGE:                                   CONSULTATION   A consult for electrophysiology.   CONSULTING PHYSICIAN:  Sherryl Manges, M.D.   CARDIOLOGIST:  Willa Rough, M.D.   PRIMARY CAREGIVER:  Dr. Dina Rich.   ALLERGY:  CORTISONE.   HISTORY OF PRESENT ILLNESS:  [name] is a 70 year old male with a prior  cardiac history which includes prior myocardial infarction in 1993 with a  finding on catheterization of ischemic cardiomyopathy and then subsequent  coronary artery bypass graft surgery x6.  His last catheterization was in  2001 and showed graft disease with a totalled saphenous vein graft to the  right coronary artery, a totalled saphenous vein graft to the diagonal.  Patient did well on post coronary artery bypass graft medical therapy with  followup echocardiogram April, 2005 showing ejection fraction 50%.  Cardiolite study March, 2005, when hospitalized with pneumonia, negative for  ischemia.  [name] presented 3 years ago with sudden onset of left arm  discomfort and dyspnea which woke him from sleep at about 11:00 that  evening.  He has had no insidious onset or progressive symptoms prior to  this.  He has no exertional, anginal symptoms.  Symptoms last 2-3 hours  total.  Patient resolved his symptoms during a nitroglycerin drip.  Cardiac  enzymes x1, troponin-I study 0.02.  Electrocardiogram was negative for ST  elevations except there were Q-waves in the inferior leads but no inverted T-  waves.  He had a left heart catheterization May 22, 2004, which showed a  new occlusion of anastomoses 2 and 3 of a three-way sequential saphenous  vein graft.  The saphenous vein graft  ran from the aorta to the obtuse  marginal, this was still patent, and then to the posterolateral branch of  the circumflex and the posterolateral  branch of the right coronary artery.  Both of those anastomoses are totally occluded.  The ejection fraction at  left heart catheterization was 30-35%.  Patient referred for  electrophysiology consult, possible placement of cardioverter defibrillator.   MEDICATIONS:  1.  Aspirin 325 mg daily.  2.  Lanoxin 0.25 mg daily.  3.  Norvasc 10 mg daily.  4.  Imdur 60 mg daily.  5.  Lotensin 40 mg daily.  6.  Diovan 80 mg daily.  7.  Lasix 40 mg twice daily.  8.  Coreg, a new medication, at 3.125 mg twice daily.  9.  Potassium chloride 40 mEq twice daily.   Patient lives in [place] with his wife.  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  No tobacco habituation, no alcoholic beverages.   FAMILY HISTORY:  The patient's mother at 28 just had a myocardial infarction  with two stents placed and is doing well.  His father  died when he was age 50  from an accident during the war, [employer] in [place], [place].  He has  a half brother but no other siblings   REVIEW OF SYSTEMS:  The patient denied having fever, chills, sweats, weight  change, adenopathy.  HEENT:  No nasal discharge, no epistaxis, no  hoarseness, no vertigo.  INTEGUMENT:  No rashes, no nonhealing lesions or  ulcerations.  CARDIOPULMONARY:  Currently without chest pain, dyspnea.  He  does not have dyspnea on exertion.  His exercise tolerance is as follows:  He is able to walk as far as he wants to without stopping from shortness of  breath.  He can climb one flight of stairs without stopping.  He does not  use more than one pillow to sleep.  He does not have paroxysmal nocturnal  dyspnea normally, although this did awake him on the evening of his  admission.  He does have lower extremity edema, he has never had syncope or  presyncope.  No claudication symptoms, no  palpitations or wheezing.  UROGENITAL:  No frequency or urgency.  NEUROLOGIC:  No weakness, numbness,  depression, anxiety.  GASTROINTESTINAL:  No history of peptic ulcer disease  or GI bleeding, no gastroesophageal reflux disease.  He has no prior history  of diabetes or thyroid disease.  MUSCULOSKELETAL:  No joint pain, no joint  swelling.  No surgeries to correct arthritic changes.  All other systems are  negative.   PHYSICAL EXAMINATION:  VITAL SIGNS:  Temperature 97.7, pulse is 57 and  regular, respirations are 20, blood pressure 147/63.  Oxygen saturation 97%  on room air.  His weight is 268.8.  Generally speaking, he is an alert and  oriented gentleman in no acute distress.  HEENT:  Normocephalic, atraumatic.  Eyes pupils equal round, reactive to  light, extraocular movements intact.  Sclerae are clear.  Nares without  discharge.  NECK:  Supple.  A soft right carotid bruit auscultated.  There is no jugular  venous distention, no cervical lymphadenopathy.  HEART:  Regular rate and rhythm with normal S1, S2 without murmur.  LUNGS:  Clear to auscultation and percussion bilaterally.  ABDOMEN:  Soft, somewhat obese, nondistended bowel sounds are present.  Abdominal aorta not palpated due to obesity.  EXTREMITIES:  Show no evidence of clubbing, cyanosis, edema.  MUSCULOSKELETAL:  No joint deformity or effusions.  NEUROLOGIC:  Cranial nerves II-XII are grossly intact.  No neurologic  deficits noted.   Electrocardiograms on May 22, 2004, day of admission:  Sinus bradycardia  with first degree AV block, inferior Q-waves in 2, 3 and AVF.  PR interval  is 256, QRS is 124, QTC is 451.  Cardiac enzymes x1 on May 22, 2004, at  9:15 a.m.:  Troponin-I study 0.02.  Serum electrolytes 142 for sodium, 4.1  potassium, chloride 110, carbonate 27, BUN 16, creatinine 1.3, glucose 103. Complete blood count:  White cells 9.5, hemoglobin 13.7, hematocrit 40,  platelets 170, digoxin 0.2.  Lipid  studies:  Cholesterol 156, triglycerides  134, HDL cholesterol 23, LDL cholesterol 106, magnesium 2.5.   ASSESSMENT:  1.  Admitted with atypical chest pain which was transient, left arm pain, no      dyspnea, no diaphoresis, no nausea, vomiting.  Troponin-I study      negative.  2.  Left heart catheterization which was on May 22, 2004, ejection      fraction of 30-35%, the saphenous vein graft to the right coronary  artery is totalled, this is chronically occluded.  The saphenous vein      graft to the diagonal is totalled, this is chronically occluded.  The      sequential saphenous vein graft to the obtuse marginal is patent but it      is occluded when it travels to the posterolateral branch of the      circumflex and is occluded as it travels to the posterolateral branch of      the right coronary artery.  These are new findings since 2001, they look      chronic, however not acute, and the left internal mammary artery to the      left anterior descending is patent.  Patient will continue on medical      therapy.  3.  Hypertension.  4.  Dyslipidemia.  5.  History of brain tumor status post resection.  6.  Low HDL cholesterol, Niaspan intolerance.  7.  Gout.  8.  Extracranial cerebrovascular occlusive disease, ultrasound monitoring by      Cardiovascular Thoracic Surgery.  9.  Melanoma status post resection.  10. History of bronchitis.  11. Renal and liver cysts on CT scan.   PLAN:  1.  DC today, May 25, 2004.  2.  Followup with Dr. Myrtis Ser on June 08, 2004, at 9:15 a.m.  3.  Followup with Dr. Graciela Husbands, electrophysiologist, Wednesday, June 03, 2004, at 1:45 p.m.   Prior to discharge Mr. Thayne has been seen by myself and the benefits of  implantation of ICD have been discussed.  His function and purpose have been  discussed as prevention against sudden cardiac death in a population with  low ejection fraction which is moving toward increased risk for sudden   cardiac death, also for malignant ventricular tachyarrhythmias, and Mr.  Manigo is a patient within this population.  He has some questions which were  fielded and he will see Dr. Graciela Husbands in followup.       GM/MEDQ  D:  05/25/2004  T:  05/25/2004  Job:  [id number]

## 2010-09-25 NOTE — Assessment & Plan Note (Signed)
[facility]                            CARDIOLOGY OFFICE NOTE   TORELL, MINDER                       MRN:          [id number]  DATE:04/26/2006                            DOB:          1941-05-07    This is a very pleasant 70 year old married white male, patient of Dr.  Myrtis Ser, who has a history of coronary artery disease and LV dysfunction.  Last Thursday, he was working in his yard and he had sudden onset of  some dizziness and said he had some numbness and tingling in his left  arm and left leg and had to drag his left leg a little bit walking to  the house.  He then developed a severe, sharp steady pain over his right  eye.  The numbness and dragging of the left foot lasted approximately 10  minutes, but the headache above his right eye lasted for 4 hours.  He  was alone when this happened.  He does not think he had any slurred  speech but he ended up going to the [facility] Emergency Room and says he  was given morphine and Ativan.  He has some papers that they sent home  with him.  His blood pressure was extremely high at 213/101 and they did  a scan of his abdomen, I am not sure if it was an ultrasound or CT, but  noted a 4 cm abdominal aortic aneurysm.  He says he has had a CT of his  head.  There is nothing in his records describing this, but we have  called [facility] and are trying to obtain those records. Carotid Dopplers  were also recommended, but he said he did not have those and they do not  have those on file.  He was sent home with some Phenergan.  The  following day, the headache resolved, but  he developed abdominal pain  and came to [facility] Emergency Room.  There he had a CT of the abdomen and  pelvis that showed no acute abnormality.  He had prostate enlargement,  nonobstructive left renal calculi and question of cholecystitis.  He  also had elevated blood pressure and was given a prescription for  clonidine 0.1 b.i.d. and told to  follow up here.   Since he has been home, he continues to complain of abdominal bloating  and fullness.  He denies any further headache, left arm or leg tingling  or numbness, but says his blood pressure has been up at home about  160/100.   CURRENT MEDICATIONS:  1. Digitek 0.25 mg daily.  2. Coreg 12.5 mg b.i.d.  3. Potassium 40 mEq b.i.d.  4. Lasix 40 mg b.i.d.  5. Nexium 40 mg daily.  6. Hydralazine 25 mg t.i.d.  7. Colchicine p.r.n.  8. Aspirin 81 mg daily.  9. Imdur 10 mg t.i.d.  10.Pepcid 20 mg daily.  11.Clonidine 0.1 mg b.i.d.  12.Charts and records say he is on Plendil 10 mg daily, but he states      he has never been on Plendil and has not had a prescription for  this.   PHYSICAL EXAMINATION:  This is a pleasant 70 year old white male in no  acute distress.  Blood pressure is 150/81.  Pulse 58.  Weight 260.  NECK:  Is without JVD, HJR, bruit or thyroid enlargement.  LUNGS:  Clear anterior, posterior and lateral.  HEART:  Regular rate and rhythm at 58 beats per minute.  Normal S1 and  S2, 1/6 systolic ejection murmur at the left sternal border noted.  ABDOMEN:  Is obese, normoactive bowel sounds are heard throughout.  It  is nontender.  EXTREMITIES:  Are without cyanosis, clubbing or edema.  He has good  distal pulses.   EKG:  Sinus bradycardia with PVCs, inferior Q waves consistent with an  old MI and old anterolateral MI.   IMPRESSION:  1. Left arm and leg numbness, weakness and headache.  Question      transient ischemic attack.  2. Abdominal fullness.  CT negative.  3. Hypertension, uncontrolled.  4. Coronary artery disease, status post coronary artery bypass graft      in 1993. Cath in January 2006 revealed a new occlusion of a vein      graft that had probably occurred before that admission.  Continue      ongoing medical therapy.  5. Ischemic cardiomyopathy, ejection fraction 35% to 40%.  6. Chronic renal insufficiency with creatinine of 1.5 to 1.6  range.  7. History of brain tumor that was removed successfully in the past.  8. History of gout.  9. Hyperlipidemia.  Has not been able to tolerate medications.  10.History of significant melanoma, removed at [facility].  11.Abdominal aortic aneurysm 4 cm on scan at [facility], results      pending.   PLAN:  At this time, I have given the patient a prescription to continue  the clonidine 0.1 mg b.i.d., I have also given him a prescription for  Plendil 5 mg daily.  I have asked him to continue to take his blood  pressure daily.  We will order carotid Dopplers, obtain all of the  results of his scans done at [facility], and he will see Dr. Myrtis Ser back in  1 to 2 weeks. He is to call us in the next several days if his blood  pressure does not come down, and we will also give him a referral to see  the GI doctors.      Jacolyn Reedy, PA-C  Electronically Signed      Cecil Cranker, MD, [facility]  Electronically Signed   ML/MedQ  DD: 04/26/2006  DT: 04/26/2006  Job #: [id number]

## 2010-09-25 NOTE — Cardiovascular Report (Signed)
NAMESANTO, ZAHRADNIK                ACCOUNT NO.:  [id number]   MEDICAL RECORD NO.:  [id number]          PATIENT TYPE:  INP   LOCATION:  6529                         FACILITY:  [facility]   PHYSICIAN:  Charlies Constable, M.D. [facility] DATE OF BIRTH:  [birth year]   DATE OF PROCEDURE:  05/22/2004  DATE OF DISCHARGE:                              CARDIAC CATHETERIZATION   PROCEDURE:  Right and left heart catheterization.   CLINICAL HISTORY:  Mr. Deckard is 70 years old and had previous bypass surgery  in 1993.  He has had previous documentation of occlusion of a vein graft to  the right coronary artery and diagonal branch in 2001.  He was admitted last  night by Dr. Diona Browner with recent onset of chest pain and shortness of  breath felt to represent unstable angina.   DESCRIPTION OF PROCEDURE:  The procedure was performed via the right femoral  artery using an arterial sheath and 6 French preformed coronary catheters.  A front wall arterial puncture was performed and Omnipaque contrast was  used.  LIMA catheter was used to inject into the LIMA graft.  A left bypass  graft catheter was used for injection of the vein graft to the circumflex  artery.  After completion of the coronary angiography, I made a decision to  perform a right heart catheterization.  This was performed via the right  femoral vein using a venous sheath and Swan-Ganz thermal dilution catheter.  The patient tolerated the procedure well and left the laboratory in  satisfactory condition.   RESULTS:  Left main coronary artery:  The left main coronary artery was free  of significant disease.   Left anterior descending artery:  The left anterior descending artery gave  rise to two sets of perforators in the diagonal branch, then was completely  occluded.  The diagonal branch had a 95% proximal stenosis.   Circumflex artery:  The circumflex gave rise to a small ramus branch and  atrial branch and then was completely occluded.   Right  coronary artery:  The right coronary artery was completely occluded  near its origin.   January 14, 2006The saphenous vein to the right coronary artery was  completely occluded at its origin.   The saphenous vein graft to the diagonal branch was completely occluded at  its origin.   The saphenous vein graft to the marginal and posterolateral branch of the  circumflex artery and posterolateral branch of the right coronary artery was  patent in its first limb but occluded in its second and third limb.  This  occlusion was new since 2001.   The LIMA graft to LAD was patent and functioned normally.  The LIMA filled  the posterolateral branch via collaterals.  __________ posterolateral branch  via collaterals.  There was a posterior descending and posterolateral branch  of the right coronary artery filled via collaterals from the native left  coronary injection.   Left ventriculogram:  The left ventriculogram in the RAO projection showed  mild hypokinesis of the anterolateral wall out to the apex.  There was  severe hypokinesis of the inferior  wall out to the apex.  The estimated  ejection fraction was 30-35%.   HEMODYNAMIC DATA:  The right aortic pressure was 15 mean.  The pulmonary  artery pressure was 54/27 with a mean of 38.  The pulmonary wedge pressure  was 31 mean.  The left ventricular pressure was 168/42.  The aortic pressure  was 168/85 with a mean of 115. Cardiac output/cardiac index was 4.2/1.8  meters/minutes/sq m.   CONCLUSION:  1.  Coronary artery disease, status post coronary artery bypass graft      surgery in 1993.  2.  Severe native vessel disease with total occlusion of the left anterior      descending artery, circumflex artery, and right coronary artery.  3.  Occluded vein graft to the right coronary artery, occluded vein graft to      the diagonal branch of the LAD, patent vein graft to the circumflex      marginal vessel but occlusion of the second and third  limbs of this      graft to the posterolateral branch of the circumflex artery and      posterolateral branch of the right coronary artery, patent LIMA graft to      the LAD.  4.  Severe left ventricular dysfunction with mild hypokinesis of the      anterolateral wall in the apex and severe hypokinesis of the inferior      wall, and an estimated ejection fraction of 30-35%.   RECOMMENDATIONS:  The patient presented with unstable angina and also has  signs of congestive heart failure.  He has a new occlusion of the distal  limbs of the circumflex graft, although this does not appear acute since he  did not bump his enzymes.  He is not a candidate for percutaneous  intervention or surgical revascularization.  May continue medical therapy as  indicated.  He may be a candidate for an ICD.  He is probably not a  candidate for a biventricular pacer but he might be a candidate for other  experimental heart failure therapy if he does not respond well to medical  therapy.       BB/MEDQ  D:  05/22/2004  T:  05/22/2004  Job:  [id number]   cc:   Dina Rich  [address]  [place]  [place] [address]  Fax: [phone]   Willa Rough, M.D.

## 2010-09-25 NOTE — Assessment & Plan Note (Signed)
[facility]                            CARDIOLOGY OFFICE NOTE   BAXTER, GONZALEZ                       MRN:          [id number]  DATE:06/21/2006                            DOB:          09-09-1940    [name] is doing very well.  See my extensive notes from the past.  See my note from May 18, 2006.  Patient has had a very careful renal  Doppler since that time.  The size of his aorta is normal.  Renal kidney  size is normal.  There is no renal artery stenosis.  He is feeling well.  His blood pressure is under good control.  He is not having any chest  pain.  He is not having any marked shortness of breath.  His blood  pressure has been under good control.   PAST MEDICAL HISTORY:   ALLERGIES:  QUESTION OF A CORTISONE ALLERGY.   MEDICATIONS:  1. Colchicine.  2. Carvedilol.  3. Potassium.  4. Famotidine.  5. Aspirin.  6. Isosorbide.  7. Digitek.  8. Hydralazine.  9. Felodipine.  10.Nexium.  11.Lasix 40 b.i.d.  12.Clonidine 0.1 b.i.d.   OTHER MEDICAL PROBLEMS:  See the extensive list on my note of June 05, 2005.   REVIEW OF SYSTEMS:  He feels great today and has no significant  symptoms.   PHYSICAL EXAMINATION:  Weight is 262 pounds, blood pressure 129/73 with  a pulse of 58.  The patient is oriented to person, time, and place.  Affect is normal.  There is no xanthelasma.  There is normal extraocular motion.  There are no carotid bruits.  There is no jugular venous distension.  LUNGS:  Clear.  Respiratory effort is not labored.  CARDIAC EXAM:  Reveals an S1 with an S2.  There is a soft systolic  murmur.  The abdomen is obese but soft.  There is no significant peripheral  edema.   The only lab done is outlined in the HPI, and that is the renal artery  ultrasound.   Problems are listed extensively on my note of February 03, 2006.  1. Episodes of volume overload.  This is stable.  It does not appear      to be from renal  artery stenosis.  2. Question of abdominal bruit.  Renal artery ultrasound is normal.  3. Blood pressure.  This appears to be stable on his current      medication.  4. Abdominal bloating.  He has had normal studies by Dr. Marina Goodell of our      GI team.   [name] is stable.  No further cardiac workup at this time.  I will  see him back in 6 months.     [name], MD, [facility]  Electronically Signed    JDK/MedQ  DD: 06/21/2006  DT: 06/21/2006  Job #: [id number]

## 2010-09-25 NOTE — Assessment & Plan Note (Signed)
HEALTHCARE                         GASTROENTEROLOGY OFFICE NOTE   [name], [name]                       MRN:          [id number]  DATE:05/04/2006                            DOB:          1941-02-01    REFERRING PHYSICIAN:  [name], MD, [facility]   REASON FOR CONSULTATION:  Persistent abdominal discomfort.   HISTORY:  This is a 70 year old white male with multiple significant  medical problems who is referred through the courtesy of Dr. Myrtis Ser  regarding persistent abdominal symptoms. The patient has a history of  coronary artery disease with prior coronary artery bypass grafting,  decreased left ventricular function with history of congestive heart  failure, mild chronic renal insufficiency, unspecified brain tumor  status post resection, hyperlipidemia, and gout. The patient has been  followed closely by Dr. Myrtis Ser for his cardiovascular disease. From this  standpoint, he has been stable during his most recent evaluation in  November. In terms of his abdominal complaints, these started in June.  He described a sensation of bloating and fullness or pressure like a  balloon. This is not necessarily affected by meals. He describes  constipation, with defection symptoms are not necessarily improved. He  also complains of dry mouth with some difficulty initiating swallowing  though no true esophageal dysphagia. He does have chronic indigestion  and heartburn. New medications include famotidine, clonidine and  Plendil. He has had no change in his appetite and no weight loss. He  presented himself to the emergency room April 24, 2006 with  complaints of abdominal discomfort. CBC, comprehensive metabolic panel,  amylase, lipase and urinalysis were unrevealing. Noncontrast enhanced CT  scan of the abdomen and pelvis suggested cholelithiasis. No acute  process. Previous CT scan at an outside facility suggested esophageal  thickening. He denies  nausea, vomiting, melena, hematochezia. No prior  history of GI evaluations. Interestingly, the patient states that his  symptoms seem to have begun in June after becoming ill after consuming a  cheeseburger. At that time, he had problems with nausea and vomiting.   PAST MEDICAL HISTORY:  As above. 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   ALLERGIES:  AUGMENTIN and CORTISONE.   CURRENT MEDICATIONS:  1. Clonidine 0.1 mg b.i.d.  2. Carvedilol 25 mg b.i.d.  3. Potassium chloride 20 mEq b.i.d.  4. Lasix 20 mg b.i.d.  5. Famotidine 20 mg b.i.d.  6. Baby aspirin.  7. Isosorbide 10 mg t.i.d.  8. Digitek 0.25 mg daily.  9. Hydralazine 25 mg t.i.d.  10.Felodipine 5 mg daily.  11.Nexium 40 mg daily.  12.He also uses oxycodone p.r.n. pain.   FAMILY HISTORY:  No family history of gastrointestinal malignancy.  Mother with heart disease.   SOCIAL HISTORY:  The patient is married. His wife was diagnosed with  Alzheimer's disease. They have one son. He is a Engineer, maintenance (IT). He had  worked previously at [employer]. He does not smoke or  use alcohol.   REVIEW OF SYSTEMS:  Per diagnostic evaluation form.   PHYSICAL EXAMINATION:  GENERAL:  Comfortable, well-appearing male in no  acute distress.  VITAL SIGNS:  Blood pressure is 134/88, heart rate is 60 and regular,  weight is 264 pounds. He is 5 feet 11 inches in height.  HEENT:  Sclera anicteric. Conjunctiva are pink. Oral mucosa intact. No  evidence of thrush, there is no adenopathy.  LUNGS:  Clear to auscultation and percussion.  HEART:  Regular without murmur.  ABDOMEN:  Soft without tenderness, mass or hernia. Good bowel sounds  heard.  EXTREMITIES:  Reveal trace edema on the left.   IMPRESSION:  This is a 70 year old gentleman with multiple significant  medical problems which are currently stable and well compensated. He  presents now with a 11-month history  of problems with fullness sensation  in the abdomen. No obvious cause by CT imaging, history or laboratories.  He has had no weight loss. His symptoms may be due to gallbladder  disease. Certainly, intraluminal GI pathology such as ulcer or partially  obstructing lesions should be excluded. He does have reflux disease by  history.   RECOMMENDATIONS:  1. Continue proton pump inhibitor therapy.  2. Schedule abdominal ultrasound to rule out gallstones as suggested      by recent CT.  3. Schedule colonoscopy and upper endoscopy to evaluate multiple      gastrointestinal symptoms and provide neoplasia screening. The      nature of both procedures as well as the risks, benefits, and      alternatives were reviewed in detail. Given his heart and kidney      problems, I would recommend PEG based prep such as Movie prep.      Otherwise, ongoing general medical care with Dr. Debby Bud and cardiac      care with Dr. Myrtis Ser.     Wilhemina Bonito. Marina Goodell, MD  Electronically Signed   JNP/MedQ  DD: 05/04/2006  DT: 05/04/2006  Job #: [id number]   cc:   [name], MD, [facility]  Rosalyn Gess. Debby Bud, MD  Garnetta Buddy, M.D.

## 2010-09-25 NOTE — H&P (Signed)
[name], [place] NO.:  [id number]   MEDICAL RECORD NO.:  [id number]          PATIENT TYPE:  INP   LOCATION:  6529                         FACILITY:  [facility]   PHYSICIAN:  Jonelle Sidle, M.D. LHCDATE OF BIRTH:  02/09/1941   DATE OF ADMISSION:  05/22/2004  DATE OF DISCHARGE:                                HISTORY & PHYSICAL   PRIMARY CARE PHYSICIAN:  Dr. Dina Rich in [place].   CARDIOLOGIST:  Dr. Willa Rough.   CHIEF COMPLAINT:  Left arm discomfort and shortness of breath.   HISTORY OF PRESENT ILLNESS:  [name] is a pleasant 70 year old male  followed by Dr. Myrtis Ser with a history of coronary artery disease status post  coronary artery bypass grafting in 1993.  Most recent angiography in  November of 2001 showed graft disease with evidence of an occluded saphenous  vein graft to the right coronary artery and a previously noted occluded  saphenous vein graft to the diagonal branch.  Other anatomy is outlined  below.  The patient has been managed medically and has done fairly well  since that time apparently with a non-ischemic Cardiolite performed back in  March of 2005 in [place].  He is transferred now from the emergency  department in [place], having presented with sudden onset left arm  discomfort and shortness of breath that woke him from sleep around 11:00  p.m. last night.  He has not had any progressive symptoms leading up to this  and denies any recent exertional symptomatology.  He was troubled by his  symptoms and took nitroglycerin and, as it did not initially help, he  presented to the emergency department.  He states that symptoms persisted  for approximately 2-3 hours and ultimately subsided, now on a nitroglycerin  drip.  His initial cardiac markers were normal and his electrocardiogram  showed non-specific changes.  He has sinus bradycardia at 57 beats per  minute with inferior Q waves and decreased anterior R wave progression as  well as prolonged PR interval.  On interview now he is comfortable, denying  any chest pain.  He has been compliant with his medications.   ALLERGIES:  Reportedly CORTISONE and NIASPAN intolerances.   MEDICATIONS AT PRESENT:  1.  Aspirin 325 mg p.o. q.d.  2.  Lasix 40 mg p.o. b.i.d.  3.  Diovan 80 mg p.o. q.d.  4.  Lotensin 40 mg p.o. q.d.  5.  Imdur 60 mg p.o. q.d.  6.  Atenolol 25 mg p.o. q.d.  7.  Norvasc 10 mg p.o. q.d.  8.  Digitek 250 mcg p.o. q.d.  9.  He has also received a single dose of Lovenox 1 mg/kg subcu.  10. He is on nitroglycerin drip at 30 mcg/minute.   PAST MEDICAL HISTORY:  1.  Multi-vessel coronary artery disease, status post coronary artery bypass      grafting back in 1993 with a LIMA to the left anterior descending,      saphenous vein graft to the right coronary artery, saphenous vein graft      to the marginal and posterolateral branches  of the circumflex and      saphenous vein graft to the diagonal branch.  The saphenous vein graft      to the diagonal branch and right coronary artery were occluded at last      catheterization in November of 2001.  The formal catheterization report      is placed in the chart regarding additional anatomy.  Ejection fraction      was estimated at 50% at that time.  Apparently [name] had a follow-up      Cardiolite that was non-ischemic back in March of 2005 in [place].  2.  Hypertension.  3.  History of mild renal insufficiency with a creatinine of 1.5.  4.  Previous history of brain tumor, apparently successfully excised in      the past.  5.  History of gout.  6.  Carotid artery disease, followed by Dr. Madilyn Fireman.  7.  Dyslipidemia with history of NIASPAN intolerance.  8.  Known dilatation of the aortic root.  9.  History of musculoskeletal back pain.  10. Previous history of renal and hepatic cysts.  11. History of melanoma.   SOCIAL HISTORY:  Patient is married and lives in [place].  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  There is no ongoing tobacco or alcohol  use history.   FAMILY HISTORY:  Noncontributory at present.   REVIEW OF SYSTEMS:  As described in history of present illness.  Otherwise,  he has been in his usual state of health.  He has not had any cough, fever,  chills, orthopnea, PND, palpitations or syncope.   PHYSICAL EXAMINATION:  VITAL SIGNS:  The heart rate is 55 and regular; blood  pressure is 138/57.  GENERAL:  This is an obese male, lying supine in no acute distress.  HEENT:  Conjunctivae look normal.  Oropharynx is clear.  NECK:  Supple without elevated jugulovenous pressure and without carotid  bruits.  No thyromegaly is noted.  LUNGS:  Clear with somewhat diminished breath sounds, but no active wheezing  or labored breathing at rest.  CARDIAC:  Regular rate and rhythm with soft systolic murmur at the apex.  There is no pericardial rub or S3 gallop.  ABDOMEN:  Soft and nontender to palpation.  EXTREMITIES:  Trace pitting edema below the knees bilaterally with 1+ pulses  bilaterally.  SKIN:  No ulcerative changes are noted.  MUSCULOSKELETAL:  No kyphosis is noted.  NEUROPSYCHIATRIC:  The patient is alert and oriented times 3.   LABORATORY DATA:  From [facility] and shows a digoxin level of 0.66,  BNP 192, INR 1.0, sodium 143, potassium 3.6, cholesterol 108, bicarb 26, BUN  28, creatinine 1.4, glucose 122, troponin I 0.0, CK 105.   Chest x-ray is reported as showing evidence of congestive heart failure.   IMPRESSION:  1.  New onset left arm and shoulder discomfort at rest associated with      shortness of breath and elevated blood pressure at 205/83 on      presentation in a 70 year old male with known multi-vessel coronary      artery disease, status post previous coronary artery bypass grafting      with medically managed graft disease by catheterization in 2001.  At     present he is pain free, electrocardiogram is non-specific and  initial      cardiac markers are negative.  2.  Hypertension.  3.  Dyslipidemia with history of NIASPAN intolerance.  He  is not on statin      therapy.  4.  Mild renal insufficiency with a creatinine at baseline in the 1.5 range.   PLAN:  1.  Patient is admitted to the telemetry.  We will continue his home      medications with the addition of Lovenox and nitroglycerin drip.  2.  Continue to cycle cardiac markers and follow-up with a fasting lipid      profile.  3.  After discussing the situation as well as the risks and benefits of      coronary angiography, plan is to proceed as such to define both coronary      and bypass graft anatomy and assess for any potential revascularization      strategies.  Further plans can be made at that time.       SGM/MEDQ  D:  05/22/2004  T:  05/22/2004  Job:  [id number]

## 2010-09-25 NOTE — Assessment & Plan Note (Signed)
[facility]                         GASTROENTEROLOGY OFFICE NOTE   [name], [name]                       MRN:          [id number]  DATE:07/13/2006                            DOB:          [birth year]    HISTORY:  Mr. Seder presents today for followup.  He is a 70 year old  with multiple significant medical problems.  He was initially evaluated  May 04, 2006, for persistent abdominal discomfort.  See that  dictation for details.  He was continued on proton pump inhibitor  therapy and scheduled for abdominal ultrasound.  Abdominal ultrasound  confirmed gallstones with no evidence of cholecystitis.  Colonoscopy and  upper endoscopy were subsequently performed May 31, 2006.  Colonoscopy was entirely normal.  Small internal hemorrhoids noted.  He  was placed on MiraLax daily for constipation.  Upper endoscopy was  essentially normal except for nonspecific gastritis.  Testing for  Helicobacter pylori was negative.  The patient presents today for  followup as requested.  Since his last visit, he states that he is doing  better.  In particularly, no problems with bloating-type abdominal  discomfort.  He is moving his bowels regularly with MiraLax.  He has  noticed a little red blood on the tissue since his colonoscopy but no  other difficulties.  Still some problems with dry mouth and moistening  foods or pills to swallow.  No significant abdominal pain.   CURRENT MEDICATIONS:  Clonidine, carvedilol, potassium chloride, baby  aspirin, Isorbid, Digitek, hydralazine, Nexium, Lasix, colchicine,  felodipine, and MiraLax.  He also uses oxycodone p.r.n. and Tums p.r.n.   PHYSICAL EXAMINATION:  GENERAL:  Finds a well-appearing male in no acute  distress.  VITAL SIGNS:  His blood pressure is 126/78, heart rate is 54 and  regular.  His weight is 263 pounds.  HEENT:  Sclerae are anicteric.  LUNGS:  Clear.  HEART:  Regular.  ABDOMEN:  Obese and soft  without tenderness, mass or hernia.  Good bowel  sounds heard.   IMPRESSION:  1. Recent problems with abdominal bloating discomfort, most likely      secondary to constipation.  Symptoms essentially resolved with      MiraLax.  2. History of reflux disease.  Asymptomatic on Nexium.  3. Cholelithiasis.  At this point, I believe it is incidental.   RECOMMENDATIONS:  1. Continue MiraLax.  2. Continue Nexium.  3. Return to the care of Dr. Myrtis Ser and Dr. Debby Bud.  GI followup as      needed.   If the patient were to develop significant abdominal discomfort despite  Nexium and MiraLax then this may be related to his gallstones, at which  point he would need surgical referal.  Again,  however, I do not think this is problematic at present.  The patient  understands the plan as outlined.     Wilhemina Bonito. Marina Goodell, MD  Electronically Signed    JNP/MedQ  DD: 07/13/2006  DT: 07/13/2006  Job #: [id number]   cc:   [name], MD, [facility]  Rosalyn Gess. Debby Bud, MD  Garnetta Buddy, M.D.

## 2010-09-25 NOTE — Discharge Summary (Signed)
NAMEJAWANN, URBANI NO.:  [id number]   MEDICAL RECORD NO.:  [id number]          PATIENT TYPE:  INP   LOCATION:  3735                         FACILITY:  [facility]   PHYSICIAN:  Jonelle Sidle, MD DATE OF BIRTH:  June 05, 1940   DATE OF ADMISSION:  01/12/2006  DATE OF DISCHARGE:                                 DISCHARGE SUMMARY   PROCEDURES:  None.   TIME OF DISCHARGE:  34 minutes   DISCHARGE DIAGNOSES:  Chest pain, cardiac enzymes negative for MI.  An outpatient Myoview  scheduled.   SECONDARY DIAGNOSES:  1. Ischemic cardiomyopathy with an EF of 31%, follow-up echocardiogram as      outpatient.  2. Status post aortocoronary bypass surgery in 1993 with LIMA to LAD, SVG      to diagonal, SVG to RCA, SVG to OM, PL and the RCA.  3. Chronic renal insufficiency with a BUN and creatinine of 17/1.5 at      discharge.  4. Hyperlipidemia.  5. Gout.  6. Gastroesophageal reflux disease.  7. History of brain tumor and resection.  8. History of carotid disease.  9. History of melanoma.  10.Osteoarthritis.  11.History of dilatation of the aortic root.  12.History of renal and hepatic cysts.  13.Allergy or intolerance to allopurinol and NSAIDs (renal failure),      Statins, cortisone, Niaspan, Augmentin.   FAMILY HISTORY:  Family history of coronary artery disease.   HOSPITAL COURSE:  Mr. Randalyn Rhea is a 70 year old male with known coronary  artery disease.  He is followed closely by Dr. Myrtis Ser and is also followed by  Dr. Graciela Husbands.  He came to the hospital because of mild dyspnea and left  scapular pain was admitted for further evaluation and treatment.   His cardiac enzymes were negative for MI.  His blood sugar was mildly  elevated at 108 and BNP mildly elevated at 295, but other labs were within  normal limits except for a platelet count of 142,000.  On January 13, 2006,  his symptoms had resolved.  His medications were adjusted with the decrease  of Coreg and digoxin,  and the addition of Isordil and hydralazine.  He is to  follow up with Dr. Myrtis Ser as an outpatient and if his EF is significantly  decreased, may be referred to Dr. Graciela Husbands for device therapy.  Mr. Randalyn Rhea was  ambulating without chest pain or shortness of breath and considered stable  for discharge on January 13, 2006, with outpatient follow-up arranged.   DISCHARGE INSTRUCTIONS:  His activity level is to be increased slowly.  He  is to stick to a 2000 mg sodium low fat diet.  He is to weigh himself daily  and record the weights and report increased weight or shortness of breath.  He is to get an echocardiogram and an adenosine stress test on September 18  at 8:30 and 8:45, respectively.  He is to follow up with Dr. Myrtis Ser on  September 27, at 11:45 and with Dr. Sol Passer as needed.   DISCHARGE MEDICATIONS:  1. Digitek 0.25 mg 1/2 tablet every other day.  2. Coreg 25 mg 1/2 tablet b.i.d.  3. Furosemide 40 mg b.i.d.  4. K-Dur 20 mEq 2 tablets b.i.d.  5. Aspirin 81 mg q.d.  6. Nexium 40 mg a day.  7. Colchicine 0.6 mg q.d.  8. Hydralazine 25 mg 1 tablet t.i.d.  9. Isordil 10 mg 1 tablet t.i.d..     ______________________________  Theodore Demark, PA-C      Jonelle Sidle, MD  Electronically Signed    RB/MEDQ  D:  01/13/2006  T:  01/13/2006  Job:  [id number]   cc:   Dina Rich

## 2010-09-25 NOTE — Discharge Summary (Signed)
[name], [name] NO.:  [id number]   MEDICAL RECORD NO.:  [id number]          PATIENT TYPE:  INP   LOCATION:  3742                         FACILITY:  [facility]   PHYSICIAN:  Jonelle Sidle, M.D. LHCDATE OF BIRTH:  [birth year]   DATE OF ADMISSION:  05/22/2004  DATE OF DISCHARGE:                                 DISCHARGE SUMMARY   ELECTROPHYSIOLOGIST:  Duke Salvia, M.D.   CARDIOLOGIST:  Willa Rough, M.D.   PRIMARY CARE PHYSICIAN:  Dina Rich, M.D.   ALLERGIES:  CORTISONE   DISCHARGE DIAGNOSES:  1.  Left arm pain with dyspnea, awakening patient at 11 p.m., May 22, 2004.  No diaphoresis, no nausea, vomiting, no particular chest pain.  2.  Status post left heart catheterization May 22, 2004. Ejection      fraction found to be 30-35% . The left main is without disease.  The      left anterior descending has a 100% mid-point stenosis.  The first      diagonal has 95% proximal stenosis.  The left circumflex is 100%      occluded after the small ramus intermedius.  The right coronary artery      is 100% occluded near the origin.  Graft disease as follows.  The      saphenous vein graft to the right coronary artery is total.  This is      known at a catheterization in 2001.  Saphenous vein graft to the      diagonal is total, known at catheterization of 2001.  The left internal      mammary artery to the left anterior descending is patent.  A sequential      saphenous vein graft to the obtuse marginal is patent but is occluded to      the posterolateral branch of the circumflex and occluded farther down to      the posterolateral branch of the right coronary artery.  These      occlusions are new since the catheterization of 2001.  3.  No recurrence of chest pain.  Troponin I studies are negative this      admission.  4.  Electrophysiology consult for cardioversion defibrillator.   SECONDARY DIAGNOSES:  1.  History of prior myocardial  infarction, 1993, with ischemic      cardiomyopathy by catheterization at that time and subsequent coronary      artery bypass graft surgery x 6.  2.  Ischemic cardiomyopathy with ejection fraction of 30-35% on      catheterization, May 22, 2004.  3.  Dyslipidemia.  4.  History of brain tumor, status post resection.  5.  Low HDL.  6.  Niaspan intolerance.  7.  Gout.  8.  Extracranial cerebrovascular occlusive disease.  9.  Melanoma, status post resection.  10. History of bronchitis.  11. Renal and liver cysts on CT scan.   PROCEDURES:  On January, 13, 2006, left heart catheterization.  Study shows  ejection fraction of 30-35%.  Saphenous vein graft to the right coronary  artery  and to the diagonal are total, as expected.  LIMA to the LAD is  patent.  Sequential saphenous vein graft to the obtuse marginal, the first  of three anastomoses is patent, but is occluded at the posterolateral branch  of the circumflex and is occluded at the posterolateral branch of the right  coronary artery.  Patient will continue on medical therapy and his atenolol  will be changed to be Coreg.   DISCHARGE DISPOSITION:  [name] is discharged May 25, 2004.  He has  been seen by the electrophysiology team and the indications for  cardioverter/defibrillator described in rough detail.  He has also been  given prescription for Coreg and potassium.  These are new medications for  him this admission.  He does discharge on the following medications.  He has  had no recurrence of chest pain since admission to the emergency room.   DISCHARGE MEDICATIONS:  1.  Aspirin 325 mg daily.  2.  Lasix 40 mg twice daily.  3.  Diovan 80 mg daily.  4.  Lotensin 40 mg daily.  5.  Imdur 60 mg daily.  6.  Coreg 3.125 mg twice daily.  7.  Norvasc 10 mg daily.  8.  Digitek 0.25 mg daily.  9.  Potassium chloride 40 mEq twice daily.   DISCHARGE ACTIVITIES:  He is asked not to lift or strain for the next two  weeks,  he is able to shower but he is asked to call [phone] if he  experiences any swelling, pain or discomfort at the catheterization site.  He has followup with Dr. Myrtis Ser June 08, 2004, at 9:15 a.m. at [facility], [address].  He will see Dr. Graciela Husbands,  electrophysiologist, at [facility], Wednesday, June 03, 2004, at  1:45 p.m.   BRIEF HISTORY:  [name] is a 70 year old male followed by Dr. Myrtis Ser.  He  has a history of coronary artery disease.  He is status post coronary artery  bypass graft surgery in 1993.  At that time, it was known that he had  ischemic cardiomyopathy.  Most recent angiography in November 2001, showed  graft disease with evidence of an occluded saphenous vein graft to the right  coronary artery and the previously noted occluded, saphenous vein graft to  the diagonal.  The patient has been managed medically since that time and  has done fairly well.  He had a nonischemic Cardiolite performed back in  March 2005 in [place].  He was admitted at that time with pneumonia.  He is  now transferred from the emergency room in [place], having presented there  with sudden-onset left arm discomfort, shortness of breath, both symptoms  having awakened him sleep about 11 o'clock on May 21, 2004.  He has not  had any progressive symptoms leading up to this and denies any recent  exertional symptomatology.  His total body symptoms:  He took nitroglycerin  and did not initially help.  He went to the emergency room.  His symptoms  persisted for approximately 2-3 hours, ultimately subsiding on nitro drip.  His initial cardiac markers were normal and his electrocardiogram showed  nonspecific changes.  There were inferior T waves in leads II, III and AVG.  However, the T waves were upright, and there were no S-T elevations.  On  presentation to [facility], he was comfortable, denying any chest pain.  He says he has been compliant with medications.  The  patient will be admitted  to telemetry, Lovenox will be added.  Nitroglycerin drip will be continued.  He will have echo, cardiac markers and followup with fasting lipid profile.  He will also proceed with coronary angiography.   HOSPITAL COURSE:  The patient presents May 22, 2004, with episode of  transient left arm pain and dyspnea.  He underwent left heart  catheterization May 22, 2004, which showed new graft occlusions as  detailed in the catheterization study, above.  Both of these looked chronic  and not acute.  The patient will be maintained on his  medical regimen but atenolol will be changed to Coreg.  Post  catheterization, he has had no complications.  His blood pressure was within  normal limits.  No cardiac dysrhythmias or respiratory compromise.  He has  been seen by electrophysiology in consult and will go home with the  medications and followup as dictated.       GM/MEDQ  D:  05/25/2004  T:  05/25/2004  Job:  [id number]   cc:   Willa Rough, M.D.   Duke Salvia, M.D.

## 2010-09-25 NOTE — Assessment & Plan Note (Signed)
[facility]                              CARDIOLOGY OFFICE NOTE   [name], [name]                       MRN:          [id number]  DATE:02/03/2006                            DOB:          [birth year]    [name] is here for cardiomegaly followup.  I have carefully reviewed all  of his recent information.  I have followed him carefully over time.  Most  recently, he was admitted to [facility]  He awoke and had some discomfort.  He then noticed marked hypertension, and he had some shortness of breath.  When this has occurred in the past, he has had episodes of pulmonary edema,  and he therefore came directly to the hospital.  He was not in pulmonary  edema.  He stabilized.  A decision was made to allow him to go home, and to  reassess his LV function and to rule out ischemia.  The patient had an  adenosine Myoview on January 25, 2006.  The study showed extensive old  inferior wall scar.  There was question of slight peri infarct ischemia, but  this was stable.  The ejection fraction was 36%.  He also had a 2D echo on  January 25, 2006.  This study showed that his ejection fraction was in the  35% to 40% range.  There is akinesis of the inferior and posterior walls.   [name] is here today.  He is stable.  He has had a headache.  This does  not appear to be significant, and he appears to be stable.  He is not having  any significant chest pain or shortness of breath at this time.   ALLERGIES:  Question of AUGMENTIN and CORTISONE.   MEDICATIONS:  1. Digitek (recently changed).  As of today, he is taking one-half of a      0.25 tablet every other day.  We will change this to one-half tablet      daily, and then switch him to 0.125 tablet.  2. Coreg, currently taking 12.5 mg b.i.d.  3. Potassium.  4. Lasix 40 mg b.i.d.  5. Nexium.  6. Plendil 10 mg.  7. Hydralazine 25 mg t.i.d.  8. Isordil 20 mg t.i.d.  9. Colchicine 0.6 mg daily.  10.Aspirin 81 mg.   OTHER MEDICAL PROBLEMS:  See the complete list below.   REVIEW OF SYSTEMS:  As mentioned, he has had some headaches.  Overall,  however, he is feeling well, and his review of systems otherwise is  negative.  The patient questions whether he has a jaundiced appearance.  I  do not feel that he appears jaundiced.   PHYSICAL EXAMINATION:  The blood pressure is 134/70.  The pulse is 55.  The patient is oriented to person, time and place, and his affect is normal.  LUNGS:  Clear.  Respiratory effort is not labored.  HEENT:  Reveals no xanthelasma.  He has normal extraocular motion.  There  are no carotid bruits.  There is no jugular venous distention.  CARDIAC:  Exam reveals an S1 with an S2.  There are no clicks or significant  murmurs.  ABDOMEN:  Soft.  EXTREMITIES:  He has no significant peripheral edema.  MUSCULOSKELETAL:  There are no musculoskeletal deformities.   PROBLEMS:  1. Coronary disease with coronary artery bypass graft in 1993,      catheterization in January of 2006 revealed a new occlusion of a vein      graft that had probably occurred before that admission at that point,      and now he is not in need of further intervention.  2. Decreased left ventricular function.  I have been trying to titrate his      medicines over time.  Finally his ejection fraction is 35% to 40%.  I      wanted to continue to treat him with medication.  Of course, we will      keep the possibility of an implantable cardioverter-defibrillator in      mind very carefully.  3. History of creatinine in the 1.5 to 1.6 range.  The patient has had a      complete evaluation and followup by Dr. Elvis Coil of nephrology.  4. History of a brain tumor that was removed successfully in the past.  5. Intolerance to Niaspan.  The patient has a low HDL.  6. History of bilateral thigh discomfort that historically is not      vascular.  7. Gout.  8. The patient has been on allopurinol in  the past, but is not currently.  9. There is some carotid disease that is followed by Dr. Madilyn Fireman.  10.Cholesterol elevation.  He has not been able to tolerate other      medications.  11.History of significant melanoma in the past that was removed      successfully at [facility]  12.History of bronchitis in the past.  13.Mild dilatation of the aortic root, to be followed.  14.History of back pain that is musculoskeletal.  15.History of renal and liver cysts seen on CT scan that do not need      further workup.  16.History of some dizziness over time that has been stable.  17.Reaction to Augmentin with swelling of the tongue.  18.History of a guaiac-positive stool one time, but the patient was worked      up and this was not a major problem.  19.Episodes of volume overload.  He is being very careful about all his      medications, and his salt and volume.  20.Question of an abdominal bruit.  The patient has had ultrasound in the      past with no evidence of renal artery stenosis in normal caliber aorta.  21.Episodes of worsening renal function when he takes non-steroidal anti-      inflammatories.  He knows not to use these medications.  22.Difficulty in dealing with the sorrow over his wife's developing severe      Alzheimer's.   [name] is stable as of today.  We are carefully watching his medications,  and all of the other problems that are listed.  At some point he may be a  candidate for an implantable cardioverter-defibrillator or other treatment,  but I am not recommending that at this time.            ______________________________  [name], MD, [facility]     JDK/MedQ  DD:  02/03/2006  DT:  02/05/2006  Job #:  [id number]

## 2010-09-25 NOTE — H&P (Signed)
[name], [name] NO.:  [id number]   MEDICAL RECORD NO.:  [id number]          PATIENT TYPE:  INP   LOCATION:  3735                         FACILITY:  [facility]   PHYSICIAN:  Jonelle Sidle, MD DATE OF BIRTH:  1940-05-28   DATE OF ADMISSION:  01/12/2006  DATE OF DISCHARGE:                                HISTORY & PHYSICAL   PRIMARY CARE PHYSICIAN:  Dina Rich, M.D., in [place].   PRIMARY CARDIOLOGIST:  [name], M.D.   ELECTROPHYSIOLOGIST:  Duke Salvia, M.D.   PATIENT PROFILE:  A 70 year old married white male with a prior history of  CAD and ischemic cardiomyopathy who presents with mild dyspnea and left  scapular pain.   PROBLEMS:  1. Coronary artery disease.      a.     Status post coronary artery bypass grafting x6 in 1993 with a       left internal mammary artery to the left anterior descending artery,       vein graft to right coronary artery, vein graft to the diagonal and a       sequential vein graft to the obtuse marginal LPL and RPL.      b.     May 12, 2004, cardiac catheterization, total occlusion of the       left anterior descending artery, circumflex artery and right coronary       artery. Total occlusion of the vein graft to the right coronary artery       as well as the vein graft to the diagonal with a patent left internal       mammary artery to left anterior descending artery and patent vein       graft to the obtuse marginal; however, sequential grafts to the RPL       and LPL were occluded. Ejection fraction was 30 to 35%.      c.     February 23, 2005, adenosine Myoview, ejection fraction 31% with       fixed anterior and inferolateral defects.  2. Ischemic cardiomyopathy, ejection fraction 31%.  3. Chronic renal insufficiency followed by Dr. Hyman Hopes.  4. Hyperlipidemia.  5. Gout.  6. Gastroesophageal reflux disease.  7. History of brain tumor status post resection.  8. History of carotid disease followed by Dr.  Madilyn Fireman.  9. History of melanoma, previously managed at [facility].  10.Osteoarthritis of bilateral knees.   HISTORY OF PRESENT ILLNESS:  A 70 year old married white male with a prior  history of CAD status post CABG x6 in 1993 whose last catheterization was in  January 2006 at which time 4 of 6 grafts were noted to be done with a patent  LIMA to LAD and patent vein graft to the obtuse marginal. Over the past  year, he has been admitted for CHF in [place] approximately three months,  requiring diuresis followed by discharge. He has otherwise been in his usual  state of health until yesterday morning when he woke up mildly short of  breath with 2/10 left scapular pain, lasting less than 10 minutes and  resolving spontaneously. Symptoms were reminiscent to prior CHF flairs.  Yesterday morning and afternoon, he worked out in his yard for about five  hours without any limitations, chest pain or shortness of breath. He slept  well last night without any PND and orthopnea and awoke again this morning  mildly shortness of breath with 2/10 left scapular pain lasting 10 minutes  and resolving spontaneously. He went into the [place] ED secondary to  recurrence of symptoms that he felt were reminiscent to CHF flares, and once  there, his blood pressure was noted to be 218/101. He was treated with  Lovenox and IV Vasotec. His EKG showed sinus bradycardia with old inferior  and anterolateral MI but otherwise no acute changes. He was then transferred  here for additional management. He is currently pain free and offers no  complaints.   ALLERGIES:  ALLOPURINOL AND NSAIDS CAUSE RENAL FAILURE. STATINS CAUSE  MYALGIAS. HE IS ALLERGIC TO CORTISONE, NIASPAN AND AUGMENTIN.   HOME MEDICATIONS:  1. Digitek 250 mcg daily.  2. Coreg 25 mg b.i.d.  3. Lasix 40 mg b.i.d.  4. Potassium chloride 40 mEq b.i.d.  5. Aspirin 81 mg b.i.d.  6. Nexium 40 mg daily.   FAMILY HISTORY:  Mother is alive at age 38. She had a MI  at age 40. Father  died at age 57 when the patient was 2 days' old secondary to falling off of  a roof. He has one step brother who is alive and well.   SOCIAL HISTORY:  He lives in [place] with his wife. He works in the  [employer]. He has a 20-pack-year history of tobacco  abuse, quitting in 1993. Denies any alcohol or drugs. He does not exercise  per se but is active around his home and yard without limitation.   REVIEW OF SYSTEMS:  Positive for left scapular pain and shortness of breath  as per HPI. Otherwise, all systems are reviewed and negative.   PHYSICAL EXAMINATION:  VITAL SIGNS:  Temperature 98.4, heart rate 45,  respirations 20, blood pressure 178/84, pulse oximetry 98% on 2 liters,  weight 118.9 kg.  GENERAL:  Pleasant, white male in no acute distress, awake, alert and  oriented x3.  NECK:  Normal carotid upstrokes. No bruits or JVD.  LUNGS:  Respirations are regular and unlabored. Clear to auscultation.  CARDIAC:  Regular. Bradycardic. S1 and S2. No S3, S4 or murmurs.  ABDOMEN:  Round, soft, nontender, nondistended. Bowel sounds positive x4.  EXTREMITIES:  Warm, dry. No clubbing, cyanosis, or edema. Dorsalis pedis and  posterior tibial pulses are 2+ and equal bilaterally.   ACCESSORY CLINICAL FINDINGS:  Chest x-ray showed no acute disease. EKG shows  sinus bradycardia with a rate 43 beats per minute and inferior and  anterolateral Qs. Hemoglobin 14.7, hematocrit 41.6, WBCs 9.1, platelets 153.  Sodium 143, potassium 4.1, chloride 109, CO2 24, BUN 19, creatinine 1.5,  glucose 128. Total bilirubin 0.8, alkaline phosphatase 58, AST 26, ALT 42,  total protein 7.5, albumin 4.0. D-dimer was negative. BNP 90.5. Digoxin 1.1.  Calcium 8.3. PTT 23.4, PT 9.9, INR 1.0. CK 60, MB 0.00.   ASSESSMENT AND PLAN:  1. Shortness of breath/left scapular pain/coronary artery disease. The      patient is now asymptomatic, and symptoms both this morning and     yesterday  morning resolved spontaneously without intervention. He notes      that the symptoms were similar to previous congestive heart failure  symptoms although his BNP is within normal limits at 90.5, and his      chest x-ray shows no acute disease. He has been transferred from      [facility] and will admit and cycle cardiac markers. We will continue his      home medications although will make dosage adjustment secondary to      bradycardia. Cardiac markers remained negative. Likely plan for      discharge tomorrow with outpatient Myoview.  2. Ischemic cardiomyopathy. Ejection fraction last documented at 31% in      October of 2006. He is euvolemic today. We would plan to reevaluate      echocardiogram as an outpatient provided that cardiac markers remain      normal and arrange for followup with Dr. Graciela Husbands regarding ICD. We will      continue Coreg although at a reduced dose secondary to bradycardia. We      will also decrease digoxin to 0.125 mg every other day given his      bradycardia, renal function and digoxin level of 1.1. No ACE or ARB      secondary to history of renal failure. We will add hydralazine and      nitrate.  3. Hypertension. Blood pressure was elevated at the outside hospital.      Currently, it is 178/84. Add hydralazine and nitrate and continue Coreg      although at a reduced dose.  4. Hyperlipidemia. He has been intolerant to statins in the past.  5. Gastroesophageal reflux disease. Continue protein pump inhibitor.     ______________________________  Nicolasa Ducking, ANP      Jonelle Sidle, MD  Electronically Signed    CB/MEDQ  D:  01/12/2006  T:  01/12/2006  Job:  [id number]

## 2010-09-28 NOTE — Consult Note (Signed)
NAMERHYDER, KOEGEL                ACCOUNT NO.:  [id number]  MEDICAL RECORD NO.:  [id number]           PATIENT TYPE:  I  LOCATION:  2901                         FACILITY:  [facility]  PHYSICIAN:  Felipa Evener, MD  DATE OF BIRTH:  [birth year]  DATE OF CONSULTATION:  Sep 03, 2010 DATE OF DISCHARGE:                                CONSULTATION   CONSULTING PHYSICIAN:  Cassell Clement, MD  REASON FOR CONSULTATION:  Cardiac arrest, and respiratory failure.  HISTORY OF PRESENT ILLNESS:  This is a 70 year old male patient who is followed by Dr. Willa Rough in the outpatient setting for known ischemic cardiomyopathy and last EF recorded at 30%.  Apparently reported to be in usual state of health up until approximately 2 weeks ago when he began to develop cough.  There was no report of fever, no report of sick exposures, no overwhelming evidence of purulence per prior clinician questioning.  He did have increased lower extremity swelling as a secondary complaint, and presents on 2010/09/03, following two progressive days of worsening shortness of breath.  He was initially seen in Urgent Care, at that time, chest x-ray was obtained demonstrating bilateral pulmonary infiltrates, and reportedly pulse oximetry was low.  He was therefore instructed to report to the emergency room for further evaluation.  He was seen in evaluation by [facility], initial diagnostic evaluation did demonstrate bilateral pulmonary infiltrates, and a BNP of over 900.  He was initially felt to possibly have some confusion, which led to the primary service initially considering ordering a CT of head, this was cancelled. The patient at that point woke up and was clearly awake, oriented and without focal deficits.  Of note, the patient has had prior CVAs, and also brain tumor resection, and the family notes that at times, Mr. Blyden can be withdrawn and difficult to communicate with.  Ultimately, his  diagnostic evaluation was continued, initial interventions consisted of supplemental oxygen, diuretics for was felt to be decompensated heart failure, and he was waiting hospital admission.  At 3:56 p.m. on 2010-09-03, he developed sudden onset of pulseless electrical activity and associated acute respiratory failure/cardiopulmonary arrest.  This was witnessed in the emergency room.  ACLS algorithm rhythm was carried out and he had return of spontaneous circulation at 4:01 p.m.  Pulmonary and Critical Care was asked to evaluate Mr. Swopes by Cardiology who was currently at bedside in the setting of acute respiratory failure, cardiopulmonary arrest, and question of need for hypothermia protocol.  PAST MEDICAL HISTORY:  Mr. Hochstatter past medical history is quite complicated and consists of the following: 1. History of nonsustained ventricular tachycardia.  He does have a     [employer] defibrillator, this was placed in January 2001. 2. Ischemic cardiomyopathy with an EF of 30% by echocardiogram. 3. Coronary artery disease with prior MI in 1993, subsequent bypass     grafting in 93 and non-ST elevation MI in November 2010, receiving     medical therapy for this. 4. Stage III chronic kidney disease with a baseline serum creatinine     of 1.7 and his discharge  creatinine in January 2011 was 1.9. 5. He has a history of gout, dyslipidemia, history of brain tumor     resection, history of benign prostatic hypertrophy, hypertension,     nephrolithiasis, cholelithiasis, gastroesophageal reflux disease     prior to cerebrovascular accident, osteoarthritis.  SOCIAL HISTORY:  Lives alone.  Does not smoke.  He is a widower.  FAMILY HISTORY:  Positive for coronary artery disease.  ALLERGIES:  Listed as CORTISONE, NSAIDs, LIPITOR and AUGMENTIN.  CURRENT MEDICATION LIST: 1. Furosemide 40 mg daily. 2. Klor-Con 20 mEq twice a day. 3. Prilosec 20 mg daily. 4. Aspirin 325 mg daily. 5. Allopurinol  400 mg daily. 6. Restasis 0.05% a drops one drop each eye twice a day. 7. Ocuvite PreserVision one tablet twice a day. 8. Flomax 0.4 mg XR one tablet twice a day. 9. Nasal moisturizer. 10.Alprazolam 0.25 mg p.r.n. 11.Plavix 75 mg daily. 12.Nitrostat p.r.n. 13.Acetaminophen 325 daily. 14.Gabapentin 300 mg three times a day. 15.Metolazone 2.5 mg daily. 16.Colchicine one tablet three times a day as needed. 17.Multivitamin daily. 18.Mucinex D as needed. 19.Antivert 25 mg every 4 hours. 20.Fluticasone 50 mcg p.r.n. 21.Tramadol 50 mg p.r.n. 22.Bethanechol chloride 25 mg three times a day. 23.Hycodan 0.5 mg p.r.n. 24.Midodrine 5 mg every morning and at 2 o'clock.  REVIEW OF SYSTEMS:  Currently unavailable.  PHYSICAL EXAMINATION:  VITAL SIGNS:  Temperature currently pending, heart rate 83, blood pressure 106/66, respirations 18, saturations 100%. GENERAL:  This is an obese 70 year old white male currently unresponsive on full ventilator support. HEENT:  He is normocephalic without jugular venous distention.  He is orally intubated. PULMONARY:  Demonstrates bibasilar rales.  Air entry is equal. CARDIAC:  Regular rate and rhythm.  There is a distant S1 and S2. ABDOMEN:  Soft, appears nontender.  There is no rigidity, he has hypoactive bowel sounds without organomegaly. EXTREMITIES:  Notable for bilateral lower extremity edema and chronic venous stasis changes. GU:  Currently awaiting Foley catheter placement. NEUROLOGIC:  Demonstrates a Glasgow coma scale of 3.  Current ventilator settings; tidal volume 640, respirations 16, PEEP of 5, FIO2 100%, currently has a plateau pressure of 32 with this.  Chest x-ray demonstrates bibasilar pulmonary infiltrates.  Arterial blood gas prior to intubations with a pH of 7.46, pCO2 45, pO2 64, bicarbonate 32, saturation 93% on nasal prongs.  FURTHER DIAGNOSTIC DATA:  BNP of 961, sodium 143, potassium 3.1, chloride 104, CO2 31, BUN 24, creatinine  1.83, glucose 115, AST 14, ALT 9.  Hemoglobin 12, hematocrit 37.9, white blood cell count 10.8, platelets are 118.  IMPRESSION AND PLAN: 1. Cardiopulmonary arrest/pulseless electrical activity arrest.  Now     status post 5 minutes of advanced cardiac life support for a     witnessed in-hospital event.  He has a known ischemic     cardiomyopathy with an ejection fraction of 30%, also known     coronary artery disease followed closely by Cardiology in the     outpatient setting.  Given the acute on chronic onset of this, and     no other findings consistent with possible other etiology, suspect     this was primary a cardiac event.  He is now status post 5 minutes     of advanced cardiac life support.  Cardiology is following     medically.  Plan at this point would be to continue full ventilator     support, initiate hypothermia protocol (see altered mental status     below).  Cycle  cardiac enzymes, maintain hemodynamics per     hypothermia protocol, and continue supportive therapy.  Further     recommendations will be added per the Primary Cardiac Service. 2. Altered sensorium/coma.  Mr. Cothran is status post 5 minutes of     advanced cardiac life support.  On pulmonary evaluation, he was on     Diprivan drip, however, this is now off.  He is not regained     consciousness, it is unclear at this point how much of this is     residual sedation, versus potential anoxic injury.  Remain hopeful     given that this was witnessed only a 5-minute period, in which,     advanced cardiac life support was performed.  We are hopeful that     cerebral perfusion has been maintained.  Given this evaluation, be     appreciate to go ahead and initiate the hypothermia protocol.     Further evaluation will be pending warmingly, i.e. CT scanning,     EEG, neurologic consultations. 3. Acute respiratory failure.  He did have underlying pulmonary     infiltrates on presentation.  Suspect this is secondary  pulmonary     edema in the setting of decompensated heart failure.  He is now on     full ventilator support.  Plan for this continue full-vent support,     continue sedation per hypothermia protocol, and follow up chest x-     ray.  He did have report of cough, therefore, we will go ahead and     send a procalcitonin, check a sputum culture, Cardiology has     started him on Avelox, which seems reasonable enough.  As he is     being admitted to the intensive care, we will go ahead and add     Rocephin as well as to cover in case this is a community-acquired     pneumonia; however, I do not think that this is the case. 4. Chronic kidney disease.  He has a baseline serum creatinine of     1.74, now 1.83.  For this, we will continue supportive care, follow     a labs closely and continue close intake and output observation. 5. Hypokalemia.  For this, we will replace conservatively given he     will be placed on the hypothermia protocol.  DISPOSITION:  Mr. Abascal is critically ill.  He is now pending admission to the coronary intensive care where the hypothermia protocol will be carried out for the next 24 hours.  Pulmonary and Critical Care will continue to monitor, 60 minutes of critical care time applied to this evaluation.     Zenia Resides, NP   ______________________________ Felipa Evener, MD    PB/MEDQ  D:  Sep 27, 2010  T:  09/02/2010  Job:  [id number]  Electronically Signed by Zenia Resides NP on 09/16/2010 12:56:00 PM Electronically Signed by Koren Bound MD on 09/28/2010 03:15:55 PM

## 2010-09-29 NOTE — Consult Note (Signed)
[name], [name]                ACCOUNT NO.:  [id number]  MEDICAL RECORD NO.:  [id number]           PATIENT TYPE:  I  LOCATION:  2901                         FACILITY:  [facility]  PHYSICIAN:  [name] Salvia, MD, FACCDATE OF BIRTH:  11-07-1940  DATE OF CONSULTATION:  09/02/2010 DATE OF DISCHARGE:                                CONSULTATION   Thank you very much for asking [place] to see [name] in consultation because of cardiac arrest.  The patient is a 70 year old gentleman with a complex history of coronary disease status post bypass in 1993, catheterization in 2010, demonstrated severe native disease, patent LIMA, patent vein graft to his OM but sequential vein grafts to the posterolateral and the PLRV. Branches were occluded.  The diagonal was occluded and the vein graft with RCA was occluded.  Ejection fraction is about 30% 2 years ago.  He was hospitalized about a year ago for acute on chronic congestive heart failure and presented to his primary care doctor a couple of days ago with increasing shortness of breath, orthopnea, and dyspnea.  He was referred from his primary care physician to the emergency room.  While there, he developed atrial flutter.  There was then apparently a fire alarm and the doors were closed.  When the doors were open, the patient was unresponsive and pulseless.  A resuscitative effort was initiated.  Subsequent interrogation of the device this morning, 24 hours later demonstrates recurrent VF in the minutes just preceding the time noted on his cardiac arrest form.  I should note that there was also no significant VT VF noted as part of the arrest record until some 5 minutes later.  At that time, amiodarone was initiated.  He is now intubated subjected to the [employer] protocol.  His past medical history in addition to the above is notable for: 1. Chronic kidney disease stage III. 2. Previously implanted ICD for primary prevention - [employer]. 3. Abdominal bruit without abdominal aneurysm. 4. Melanoma with post resection. 5. History of brain tumor status post resection. 6. Dyslipidemia and intolerance to statins. 7. Hypertension. 8. History of stroke. 9. Orthostatic intolerance.  PAST SURGICAL HISTORY:  Notable for the melanoma resection, brain tumor resection.  Medications as an outpatient included furosemide, potassium, aspirin, Plavix, gabapentin, metolazone, colchicine, Antivert, fludrocortisone, midodrine.  ALLERGIES:  CORTISONE, AUGMENTIN, and NSAIDS.  SOCIAL HISTORY:  He lives in [place] by himself.  REVIEW OF SYSTEMS:  Not obtainable since he is intubated.  PHYSICAL EXAMINATION:  GENERAL/VITAL SIGNS:  He is intubated Caucasian male with the heart rate of 56, he is intubated.  Blood pressure is 146/87.  The skin was cool.  He was intubated and unresponsive. LUNGS:  There were rhonchi bilaterally. HEART:  Heart sounds were regular. ABDOMEN:  Soft. EXTREMITIES:  Had trace edema.  SCDs were in place. NEUROLOGICAL ASSESSMENT:  Not possible.  LABORATORIES:  His potassium pre arrest was 3.1.  Magnesium was not obtained.  Device was interrogated, multiple episodes of polymorphic VT were seen in the minute or 2 before the time inscribed on the code sheet, where PEA was described.  Cardiac markers on arrival were also negative.  BNP was elevated at 960. Post arrest, the labs are abnormal with persistent hypokalemia.  DISCUSSION:  I suspect the patient had a primary VF arrest in the setting of his hypokalemia and his congestive heart failure.  I think that PEA which was listed on the code sheet was a secondary rhythm following multiple ICD discharge that were delivered in the minute or 2 prior so the patient being found unresponsive.  We cannot confirm this because strips are not available.  Unfortunately, there was a concurrent fire drill which may have prompted the inattention to telemetry.   These telemetry data apparently are also not retrievable at this time.  In any case, the patient is subjected to [facility].  Recovery will be followed.  Potassium repletion is essential.  I would continue his amiodarone for now.  He would need to undergo cardiac catheterization.  He has a history of bradycardia and subsequent configuration of his device may be appropriate.  We will follow with you.  Thank you for the consultation.     [name] Salvia, MD, [facility]     SCK/MEDQ  D:  09/02/2010  T:  09/03/2010  Job:  [id number]  Electronically Signed by Sherryl Manges MD [facility] on 09/29/2010 04:25:15 PM

## 2010-10-08 NOTE — H&P (Signed)
[name], [name] NO.:  [id number]  MEDICAL RECORD NO.:  [id number]           PATIENT TYPE:  I  LOCATION:  2901                         FACILITY:  [facility]  PHYSICIAN:  Cassell Clement, M.D. DATE OF BIRTH:  [birth year]  DATE OF ADMISSION:  08/25/2010 DATE OF DISCHARGE:                             HISTORY & PHYSICAL   PRIMARY CARDIOLOGIST:  [name], MD, [facility]  PRIMARY CARE PROVIDER:  Rosalyn Gess. Norins, MD  CHIEF COMPLAINT:  Fluid build up and shortness of breath.  HISTORY OF PRESENT ILLNESS:  This is a 70 year old Caucasian gentleman with history of known ischemic cardiomyopathy, last ejection fraction noted was 30%, the patient is status post ICD placement, first chronic systolic heart failure in January 2011.  The patient has been doing fairly well from a cardiac standpoint.  His last admission for heart failure was over a year ago.  Over the last several weeks, the patient has noticed increased cough with a light green, yellowish sputum but without diaphoresis, fevers, or chills.  Three days ago, he noticed elevated blood pressures and felt lightheaded; however, this subsided quickly.  Since that time, he has noted increased lower extremity edema. He always sleeps in a recliner but over the last several nights, he has been unable to sleep secondary to dyspnea.  He also endorses orthopnea and PND.  He thought that his lower extremity edema was secondary to his known history of gout but with his increased dyspnea, he was evaluated at urgent care.  At that time, the patient was told he had possible pneumonia and was mildly hypoxic and was sent to the emergency department for further evaluation.  Upon initial review of the patient, he was mildly confused and was not fully arousable.  Therefore, an ABG was obtained, this was within normal limits.  The patient was then taken for a CT of the head, upon entering the room the patient became oriented and  refused the exam; therefore, this was cancelled.  The patient has been given 40 mg of IV Lasix.  He states his dyspnea has improved with oxygen.  A chest x-ray in our emergency department showed bilateral pulmonary interstitial opacities suggestive interstitial edema as well as tiny bilateral pleural effusions.  Initial BNP is noted to be 961.  The patient's initial EKG showed normal sinus rhythm with occasional PVCs.  A followup EKG approximately 3 hours later shows sinus tachycardia at 124 beats per minute.  The patient denies any chest pain, syncope, change in bowel or bladder habits, as well as no nausea or vomiting.  Cardiology was asked to admit the patient for further evaluation.  PAST MEDICAL HISTORY: 1. Coronary artery disease status post coronary artery bypass grafting     in 1993.  The patient has had followup catheterizations in 2006 and     2010 without change in coronary anatomy. 2. Chronic systolic congestive heart failure status post ICD in 2011.     a.     Ischemic cardiomyopathy, ejection fraction 30%. 3. History of nonsustained VT status post ICD in 2011. 4. Chronic kidney disease, stage III, creatinine 1.9  in February 2012. 5. Gout. 6. Dyslipidemia, intolerant to STATINS. 7. History of brain tumors status post resection. 8. BPH. 9. Hypertension. 10.Gastroesophageal reflux disease. 11.History of CVA. 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 13.Carotid artery disease, stable per last ultrasound. 14.History of abdominal bruit. 15.Grief over wife with dementia. 16.Melanoma status post resection.  SOCIAL HISTORY:  The patient lives in [place] by himself.  He is a widower.  He denies any tobacco or alcohol use.  FAMILY HISTORY:  His mother is alive at age of 37.  His father died when the patient was 2 and therefore, he does not know any history.  No known premature coronary artery disease.  ALLERGIES: 1. CORTISONE. 2. AUGMENTIN. 3. NSAIDS.  HOME MEDICATIONS: 1. Allopurinol  400 mg daily. 2. Coreg 6.25 mg twice daily. 3. Plavix 75 mg daily. 4. Lasix 80 mg twice daily. 5. Neurontin 300 mg three tablets a day. 6. Flomax 0.4 mg nightly. 7. Ocuvite one tablet twice daily. 8. Alprazolam 0.25 mg as needed. 9. Midodrine 5 mg every morning and at 2 p.m. 10.Antivert 25 mg every 4 hours.  REVIEW OF SYSTEMS:  All pertinent positives and negatives as stated in HPI.  All other systems have been reviewed and are negative.  PHYSICAL EXAMINATION:  VITAL SIGNS:  Pulse 63, respirations 20, blood pressure 129/55, O2 saturation 100% on 3 liters. GENERAL:  This is an obese elderly gentleman, currently in no acute distress. HEENT:  Normal. NECK:  Supple with possible JVD although neck capitis is difficult to obtain. HEART:  Regular rate and rhythm with S1 and S2.  There is a soft systolic murmur.  Pulses 2+ and equal bilaterally. LUNGS:  Bibasilar rales with left greater than right. ABDOMEN:  Soft, obese, nontender, positive bowel sounds x4. EXTREMITIES:  Left greater than right lower extremity, 1-2 plus pitting edema, no clubbing or cyanosis. MUSCULOSKELETAL:  No joint deformities or effusions. NEUROLOGIC:  Alert and oriented x3, cranial nerves II through XII grossly intact.  Chest x-ray showing cardiomegaly with pulmonary vascular congestion and probable interstitial edema.  Tiny bilateral pleural effusions. Initial EKG showing normal sinus rhythm with occasional PVCs.  Followup EKG showing sinus tachycardia at 124 beats per minute.  There are nonspecific T-wave changes.  LABORATORY DATA:  WBC 10.8, hemoglobin 12, hematocrit 37.9, platelets 118.  Sodium 143, potassium 3.1, chloride 104, bicarb 31, BUN 24, creatinine 1.83.  Point-of-care markers negative x1.  BNP 961.  ASSESSMENT AND PLAN:  This is a 70 year old Caucasian gentleman with known chronic systolic congestive heart failure/ischemic cardiomyopathy status post implantable cardioverter-defibrillator and  known coronary artery disease status post coronary artery bypass grafting who presents with acute-on-chronic systolic congestive heart failure exacerbation and probable bronchitis as well as sinus tachycardia.  The patient will be admitted to the telemetry.  His p.o. Lasix will be changed IV twice daily for more appropriate diuresing.  We will get a 2-D echo as his last echo was over a year ago.  We will empirically treat with IV Avelox as well as continue with Mucinex.  The patient's ins and outs will be monitored closely as well as his daily weights.  Further treatment will be dependent upon the above results.     Leonette Monarch, PA-C   ______________________________ Cassell Clement, M.D.    NB/MEDQ  D:  09/02/2010  T:  09/02/2010  Job:  [id number]  Electronically Signed by Alen Blew P.A. on 10/06/2010 10:09:11 AM Electronically Signed by Cassell Clement M.D. on 10/08/2010 12:47:46 PM

## 2010-10-09 DEATH — deceased

## 2010-11-02 NOTE — Discharge Summary (Signed)
NAMEREYNOL, ARNONE NO.:  [id number]  MEDICAL RECORD NO.:  [id number]  LOCATION:  2909                         FACILITY:  [facility]  PHYSICIAN:  Cassell Clement, M.D. DATE OF BIRTH:  Mar 24, 1941  DATE OF ADMISSION:  Sep 25, 2010 DATE OF DISCHARGE:  October 15, 2010                              DISCHARGE SUMMARY   DEATH SUMMARY:  DATE OF DEATH:  2010-10-15, at 12:29 a.m.  CAUSE OF DEATH:  Asystole.  SECONDARY DIAGNOSES: 1. Coronary artery disease status post bypass grafting in 1993, also     chronic systolic heart failure with implantable cardioverter-     defibrillator implanted in 2011, history of ischemic cardiomyopathy     with an ejection fraction of 30%, history of nonsustained V-tach     post implantable cardioverter-defibrillator in 2011, stage III     chronic kidney disease with a baseline creatinine of 1.9. VDRF. 2. History of gout. 3. Hyperlipidemia. 4. History of brain tumor, post resection. 5. History of benign prostatic hypertrophy. 6. History of hypertension. 7. Gastroesophageal reflux disease. 8. History of cerebrovascular accident. 9. History of polio at age 74. 10.History of carotid artery disease, stable per last ultrasound. 11.History of abdominal bruits. 12.History of melanoma status post resection. 13.History of mild dementia.  HOSPITAL COURSE:  The patient was a 69 year old male with past medical history noted above.  On admission he noted that several weeks prior to that, had noticed increased cough with light green sputum without any diaphoresis, fever or chills.  Three days prior to admission, the patient noted that he had elevated blood pressure and felt lightheaded, also noted increased lower extremity edema.  Upon arrival to the ED, the patient was found to have pneumonia.  His chest x-ray demonstrated bilateral interstitial opacities along with small pleural effusions. His initial BNP was 961.  Initial EKG was normal sinus rhythm  with some PVCs.  The patient was initially admitted and treated for CHF exacerbation along with pneumonia. In the ED patient had a cardiac arrest and was resucitated successfully. He was given IV Avelox for pneumonia along with IV Lasix for CHF.  The patient initially appeared to be improving.  However, on Sep 10, 2010, he started developing altered mental status.  The patient was taken down for head CT during which the patient had PEA arrest, during which the patient was intubated and sent to Intensive Care Unit, Critical care medicine were managing the patient along with Cardiology.  Over the next several days the patient was in fluid overload, IV lasix was given to him, however the patient diuresing  very poorly during that time. He was agitated on Sep 15, 2010 and self- extubated.  After the next following days, his respiratory status remained critical, given lack of signs of improvement, goals of  care was discussed with the family and after reflection decided  to make the patient DNR. Also the patient's ICD was switched off on  Sep 20, 2010.  On 10/15/2010, the patient had asystole at 12:29 a.m.  and MD was notified, and time of death was noted.     Darnelle Maffucci, MD   ______________________________ Cassell Clement, M.D.  PT/MEDQ  D:  10/21/2010  T:  10/22/2010  Job:  [id number]  Electronically Signed by Darnelle Maffucci  on 10/22/2010 11:39:35 AM Electronically Signed by Cassell Clement M.D. on 11/02/2010 07:31:33 PM

## 2011-01-27 LAB — POCT CARDIAC MARKERS
Myoglobin, poc: 168
Operator id: 294501

## 2011-01-27 LAB — B-NATRIURETIC PEPTIDE (CONVERTED LAB): Pro B Natriuretic peptide (BNP): 312 — ABNORMAL HIGH

## 2011-01-27 LAB — I-STAT 8, (EC8 V) (CONVERTED LAB)
BUN: 14
Bicarbonate: 28.4 — ABNORMAL HIGH
Chloride: 108
Glucose, Bld: 144 — ABNORMAL HIGH
Hemoglobin: 14.6
Sodium: 142

## 2011-01-27 LAB — BASIC METABOLIC PANEL
BUN: 13
CO2: 33 — ABNORMAL HIGH
Calcium: 8.7
Chloride: 104
Creatinine, Ser: 1.41
GFR calc Af Amer: >60
Glucose, Bld: 127 — ABNORMAL HIGH

## 2011-01-27 LAB — CBC
HCT: 42
MCV: 94.8
Platelets: 142 — ABNORMAL LOW
RDW: 14.3
WBC: 8.3

## 2011-01-27 LAB — DIFFERENTIAL
Eosinophils Absolute: 0.1
Eosinophils Relative: 2
Lymphs Abs: 1.6
Monocytes Absolute: 0.5

## 2011-01-27 LAB — TSH: TSH: 1.593

## 2011-01-27 LAB — TROPONIN I: Troponin I: 0.03

## 2011-01-27 LAB — DIGOXIN LEVEL: Digoxin Level: 0.6 — ABNORMAL LOW

## 2011-01-27 LAB — HEMOGLOBIN A1C: Mean Plasma Glucose: 136

## 2011-01-28 LAB — CARDIAC PANEL(CRET KIN+CKTOT+MB+TROPI)
CK, MB: 1.6
Total CK: 62
Troponin I: 0.03

## 2011-01-28 LAB — BASIC METABOLIC PANEL
Calcium: 8.6
GFR calc Af Amer: >60
GFR calc non Af Amer: 50 — ABNORMAL LOW
Potassium: 4
Sodium: 141

## 2011-02-05 LAB — CBC
HCT: 39
Hemoglobin: 13.1
MCHC: 33.6
Platelets: 124 — ABNORMAL LOW
RDW: 13.7

## 2011-02-05 LAB — DIFFERENTIAL
Basophils Absolute: 0
Basophils Relative: 0
Eosinophils Relative: 1
Lymphocytes Relative: 16
Monocytes Absolute: 0.7
Neutro Abs: 7.1

## 2011-02-05 LAB — POCT I-STAT, CHEM 8
Calcium, Ion: 1.09 — ABNORMAL LOW
Chloride: 105
HCT: 38 — ABNORMAL LOW
Hemoglobin: 12.9 — ABNORMAL LOW
Potassium: 3.2 — ABNORMAL LOW

## 2011-02-05 LAB — POCT CARDIAC MARKERS
CKMB, poc: 1.2
Troponin i, poc: <0.05

## 2011-02-18 LAB — BASIC METABOLIC PANEL
Chloride: 105
Creatinine, Ser: 1.65 — ABNORMAL HIGH
GFR calc Af Amer: 51 — ABNORMAL LOW
GFR calc non Af Amer: 42 — ABNORMAL LOW
Potassium: 3.1 — ABNORMAL LOW

## 2011-02-18 LAB — HEMOGLOBIN AND HEMATOCRIT, BLOOD
HCT: 39.8
Hemoglobin: 13.8

## 2011-02-24 LAB — URINALYSIS, ROUTINE W REFLEX MICROSCOPIC
Bilirubin Urine: NEGATIVE
Nitrite: NEGATIVE
Protein, ur: NEGATIVE
Specific Gravity, Urine: 1.013
Urobilinogen, UA: 2 — ABNORMAL HIGH

## 2011-02-24 LAB — I-STAT 8, (EC8 V) (CONVERTED LAB)
BUN: 19
Bicarbonate: 30.3 — ABNORMAL HIGH
Chloride: 104
Glucose, Bld: 126 — ABNORMAL HIGH
HCT: 36 — ABNORMAL LOW
Operator id: 234501
pCO2, Ven: 56.7 — ABNORMAL HIGH
pH, Ven: 7.335 — ABNORMAL HIGH

## 2011-02-24 LAB — POCT I-STAT CREATININE
Creatinine, Ser: 1.6 — ABNORMAL HIGH
Operator id: 234501

## 2011-12-22 IMAGING — CR DG CHEST 1V PORT
1 series · 1 of 1 positions shown · non-contrast
Comparison: 09/12/2010

CLINICAL DATA: Ventilator patient.

PORTABLE CHEST - 1 VIEW

[view not recorded]
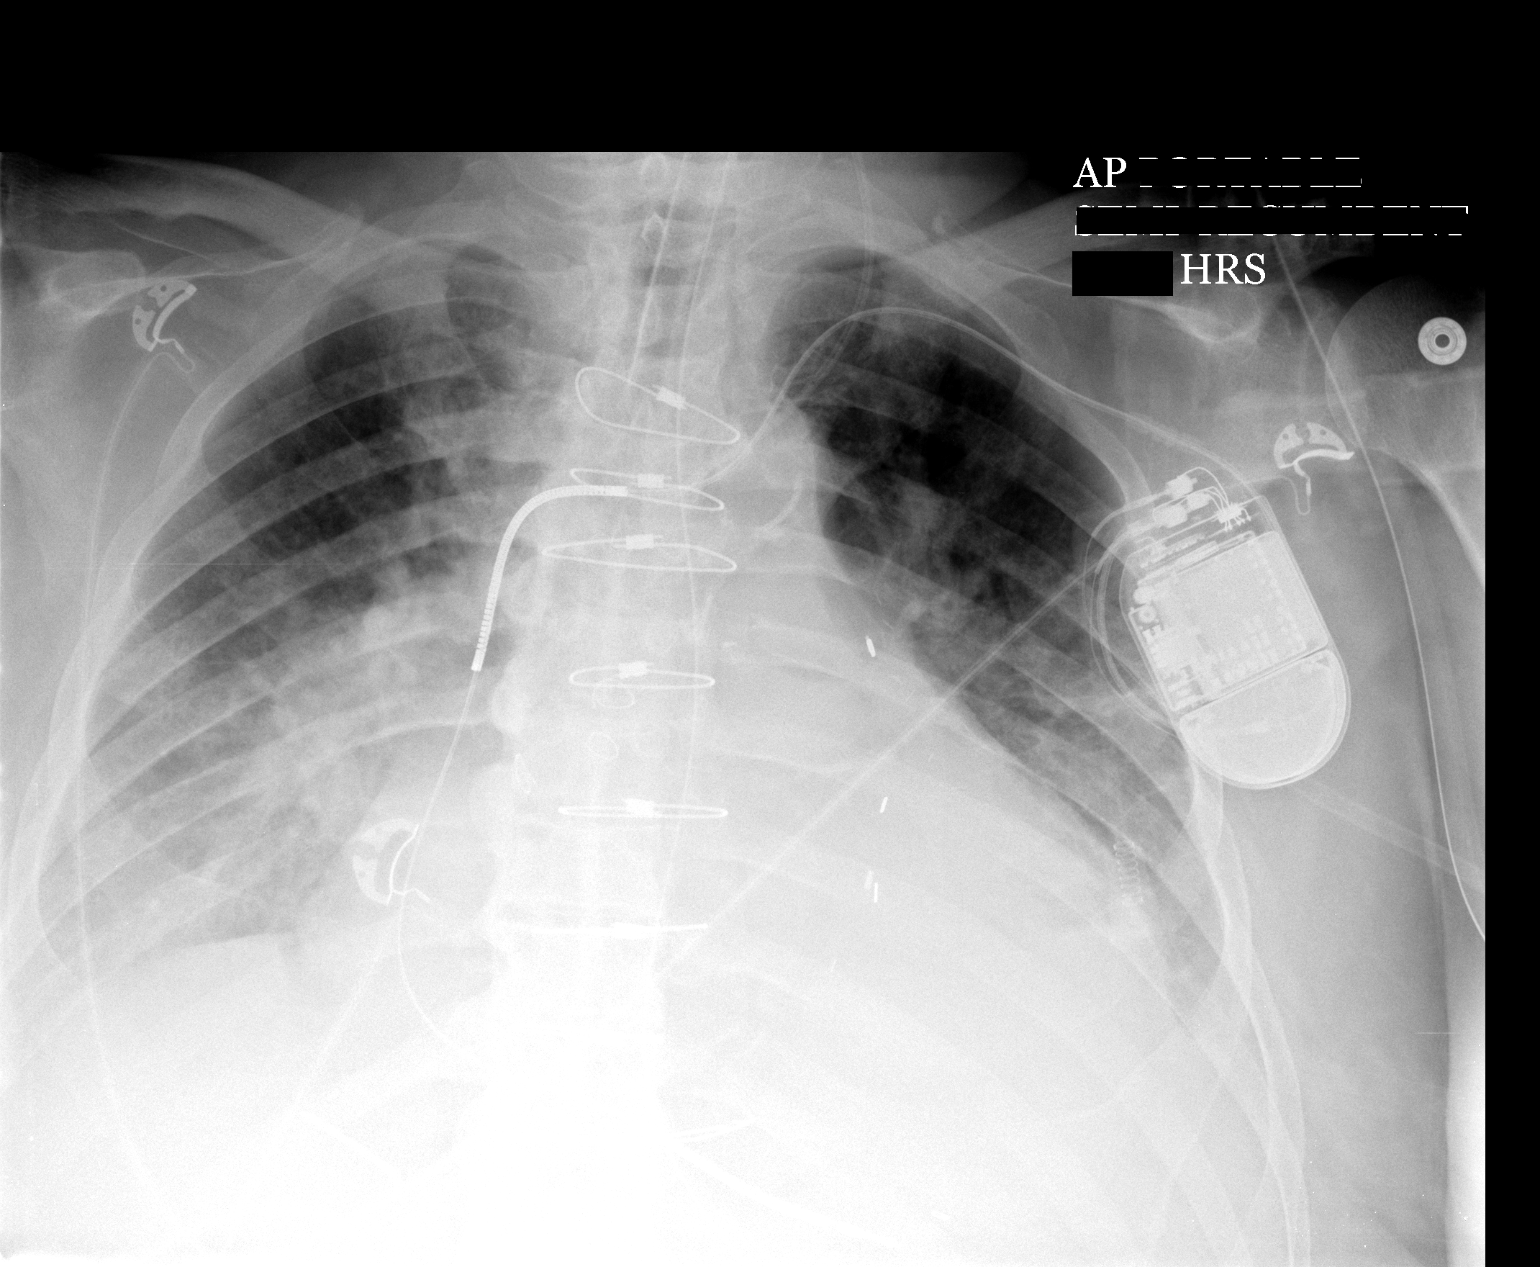

[1 of 1 positions shown; findings below may reference images not displayed]

FINDINGS: The endotracheal tube is approximately 4.8 cm above the
carina.  There is a left cardiac ICD.  Cardiac silhouette is
enlarged.  Persistent basilar opacifications with vascular
congestion or edema.  Minimal change from the prior examination.
Nasogastric tube extends into the abdomen.  There is a left jugular
central venous catheter and the tip is probably near the junction
of the left innominate vein and SVC.
IMPRESSION: Stable chest radiograph findings.  Findings are most compatible
with edema and basilar densities.

Cardiomegaly.

## 2011-12-26 IMAGING — CR DG CHEST 1V PORT
1 series · 1 of 1 positions shown · non-contrast
Comparison: 09/16/2010

CLINICAL DATA: Respiratory distress.

PORTABLE CHEST - 1 VIEW

[AP]
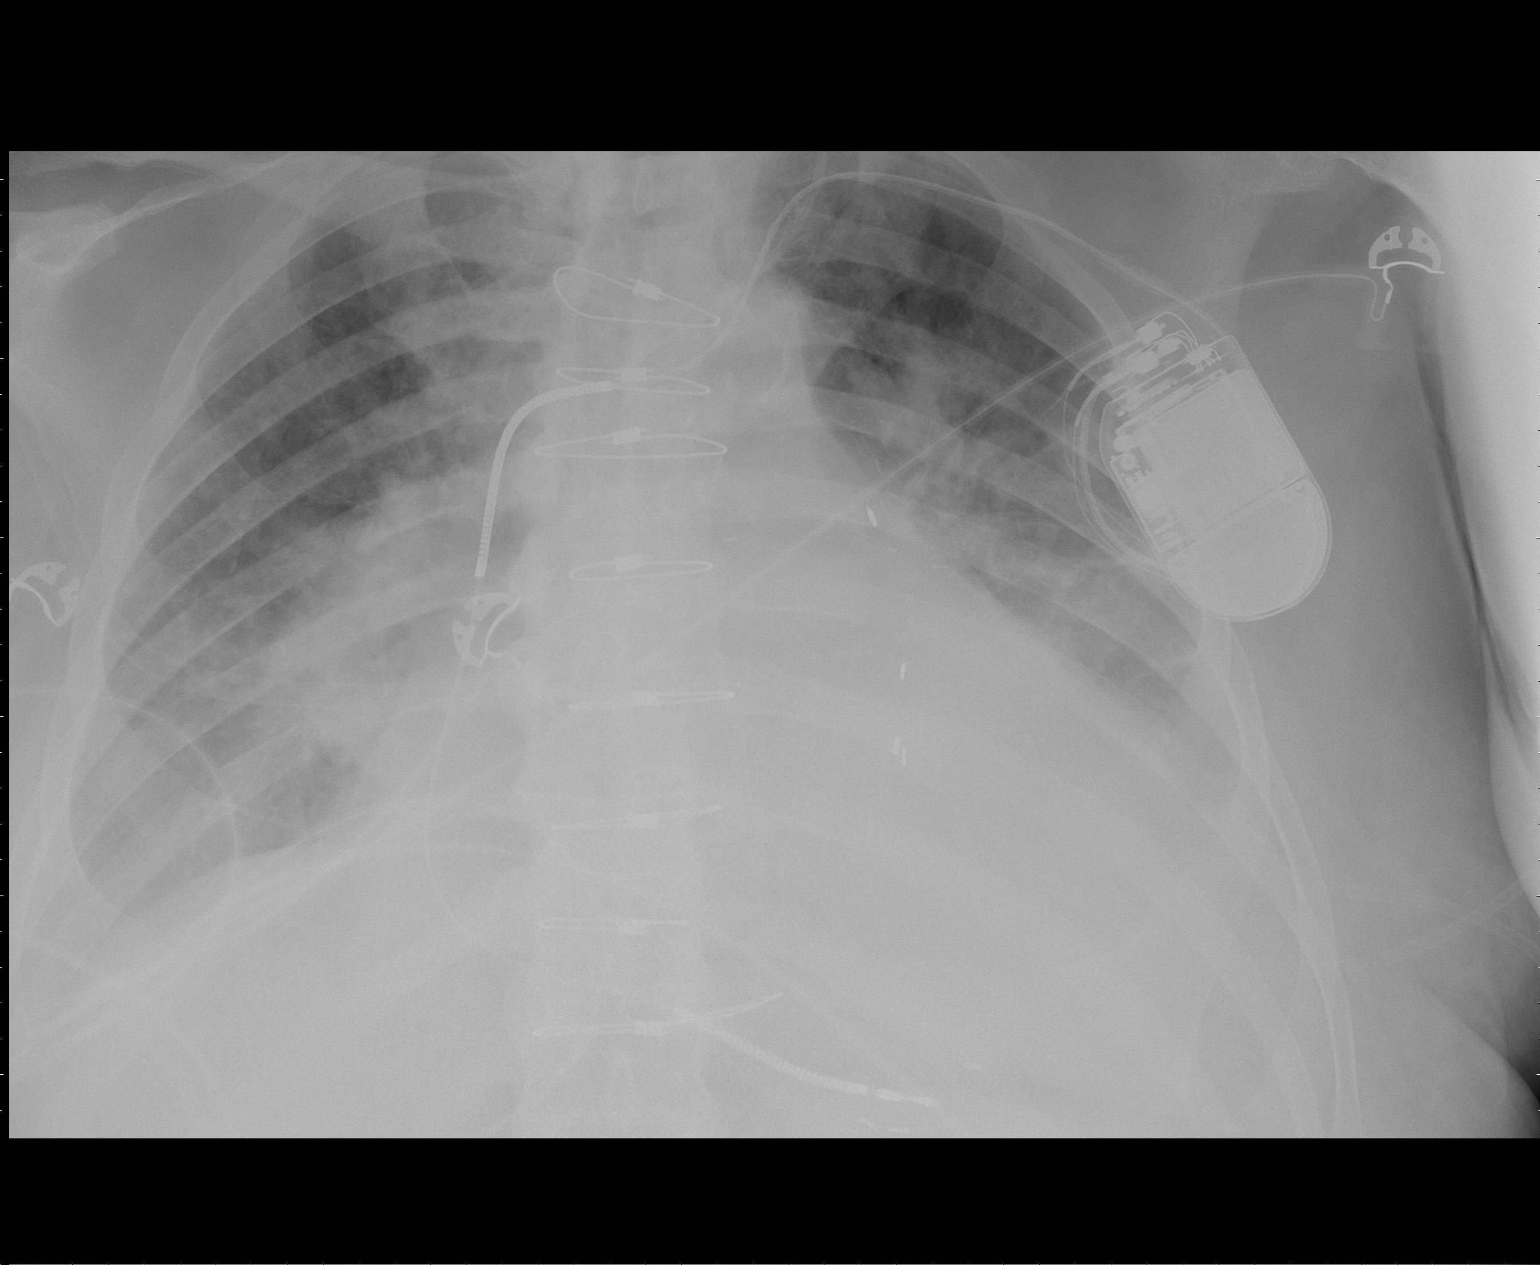

[1 of 1 positions shown; findings below may reference images not displayed]

FINDINGS: Left pacer and central line remain in place, unchanged.
No endotracheal tube visualized.  There is cardiomegaly with
vascular congestion and worsening bilateral airspace disease,
likely edema.  Left lower lobe atelectasis or consolidation noted
with small left effusion.
IMPRESSION: Worsening diffuse bilateral airspace disease, likely edema.

Continued left lower lobe atelectasis or consolidation with left
effusion.  Stable cardiomegaly.

## 2011-12-27 IMAGING — CR DG CHEST 1V PORT
1 series · 1 of 1 positions shown · non-contrast
Comparison: 09/17/2010

CLINICAL DATA: Congestion.

PORTABLE CHEST - 1 VIEW

[AP]
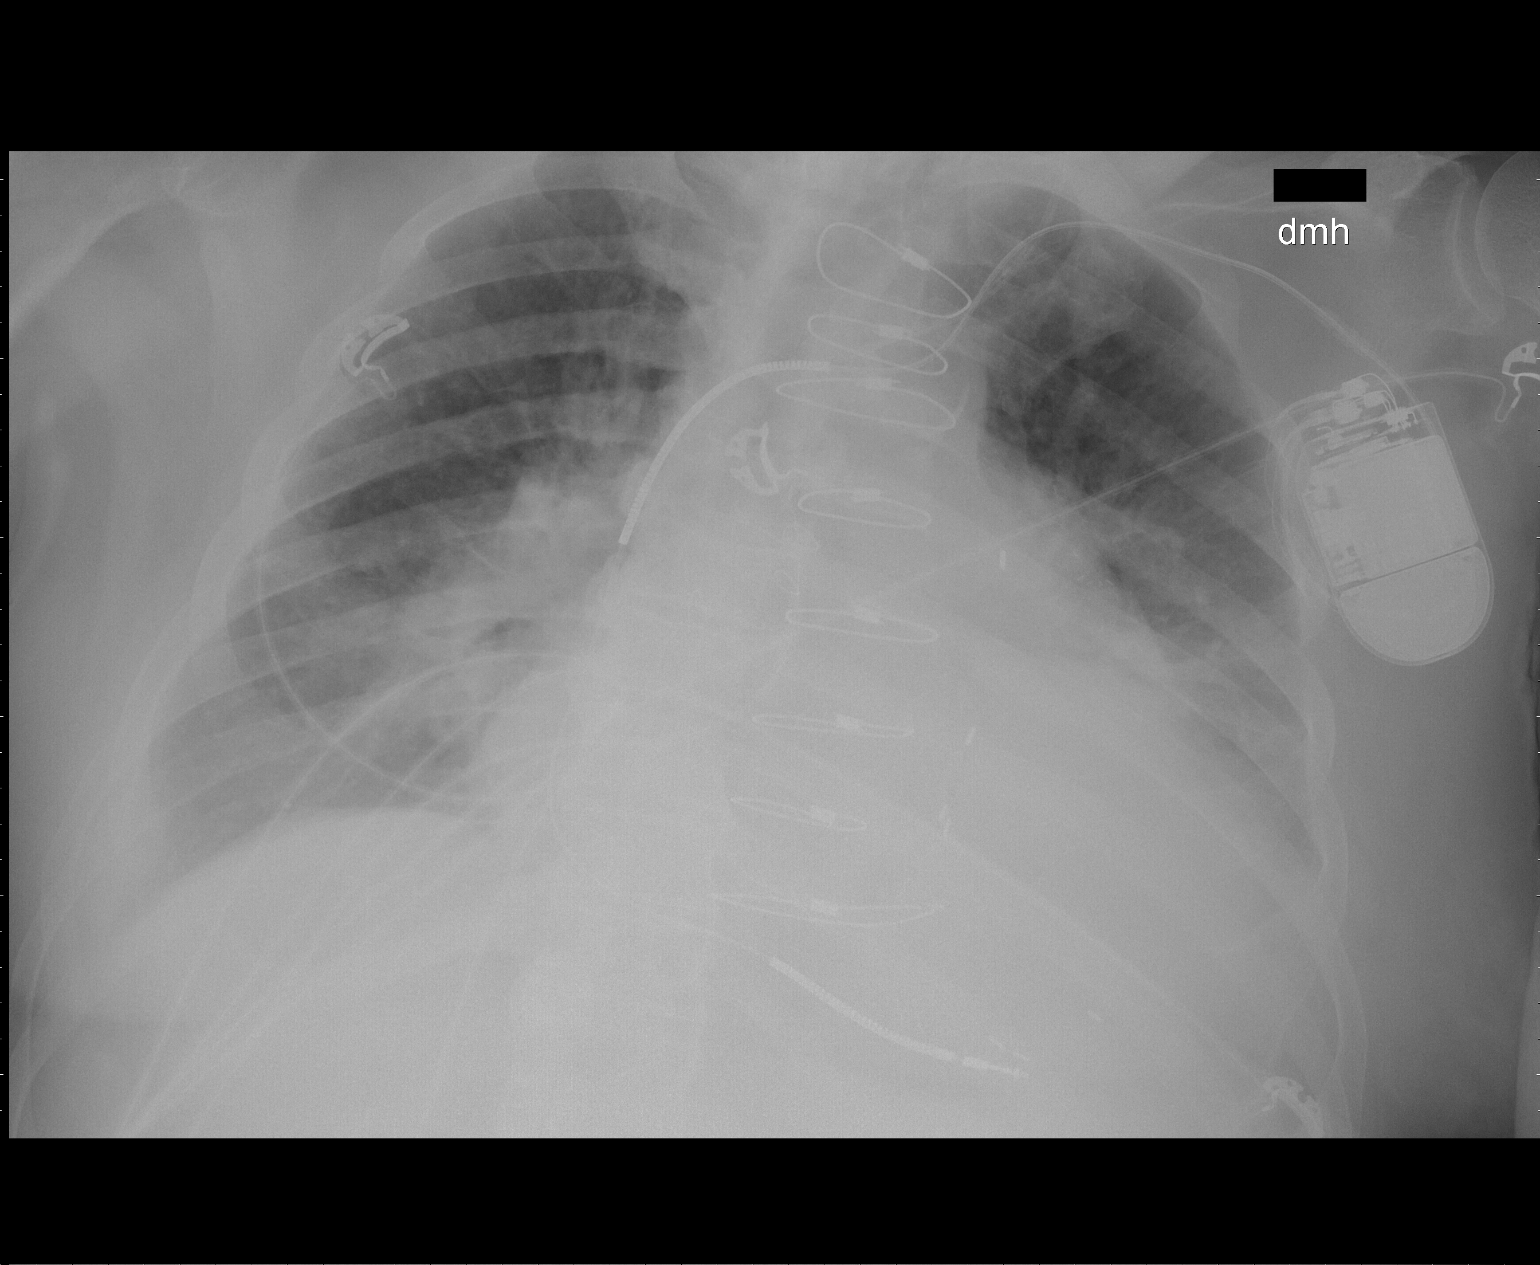

[1 of 1 positions shown; findings below may reference images not displayed]

FINDINGS: Left AICD.  There is cardiomegaly with vascular
congestion and mild edema pattern, slightly improved.  Bibasilar
atelectasis with small effusions persist.
IMPRESSION: Slight improvement in edema pattern.  Otherwise no change.

## 2011-12-28 IMAGING — CR DG CHEST 1V PORT SAME DAY
1 series · 1 of 1 positions shown · non-contrast
Comparison: Portable chest x-ray yesterday and dating back to
09/13/2010.

CLINICAL DATA: Cardiac arrest.  Follow up pulmonary edema and
effusions.

PORTABLE CHEST - 1 VIEW SAME DAY 09/19/2010:

[view not recorded]
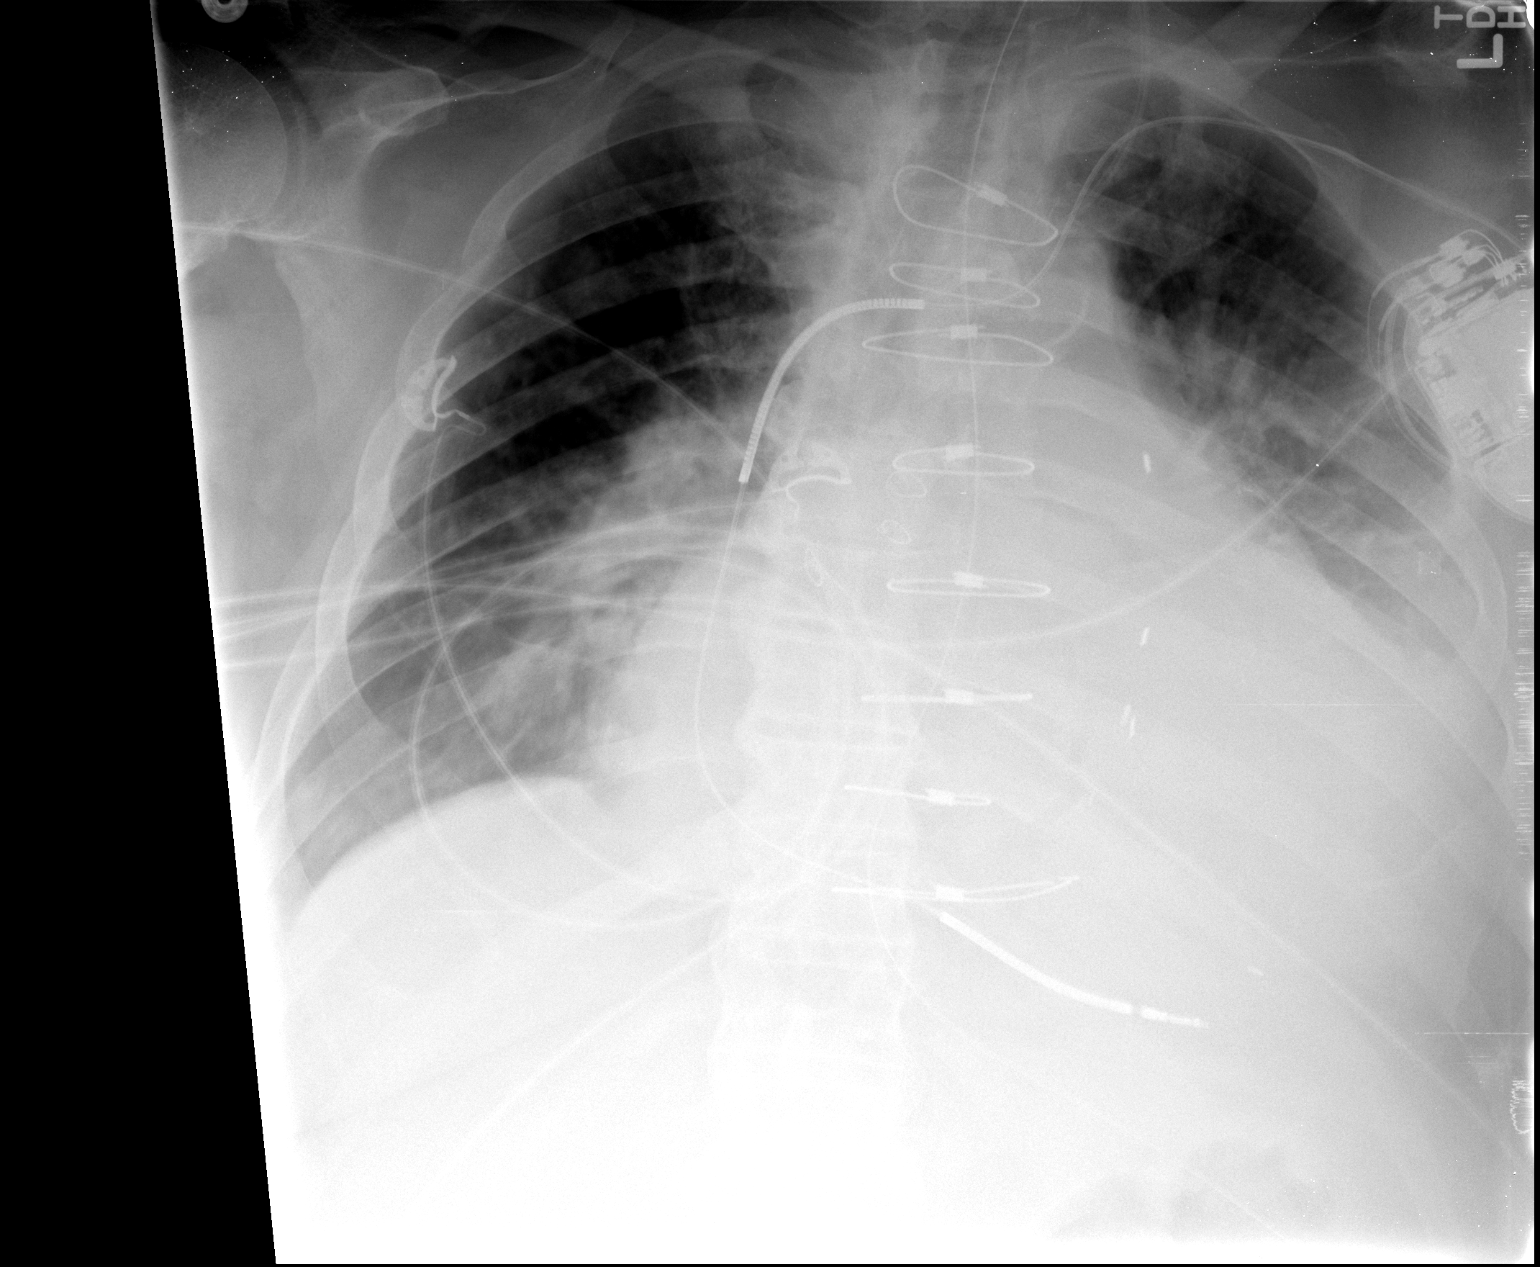

[1 of 1 positions shown; findings below may reference images not displayed]

FINDINGS: Sternotomy for CABG.  Cardiac silhouette markedly
enlarged but stable.  Left subclavian pacing defibrillator
unchanged in intact.  Nasogastric courses below the diaphragm into
the stomach.  Left jugular central venous catheter tip remains at
the junction of the innominate vein and SVC.  No significant
interval change in the pulmonary venous hypertension and mild
interstitial pulmonary edema.  Stable small bilateral pleural
effusions.  Stable dense consolidation in the left lower lobe.  No
new pulmonary parenchymal abnormalities.
IMPRESSION: Support apparatus satisfactory.  Stable mild interstitial pulmonary
edema.  Stable dense left lower lobe atelectasis and/or pneumonia.
Stable bilateral pleural effusions.  No new abnormalities.

## 2011-12-29 IMAGING — CR DG CHEST 1V PORT
1 series · 1 of 1 positions shown · non-contrast
Comparison: 09/19/2010.

CLINICAL DATA: Cardiac arrest.

PORTABLE CHEST - 1 VIEW

[AP]
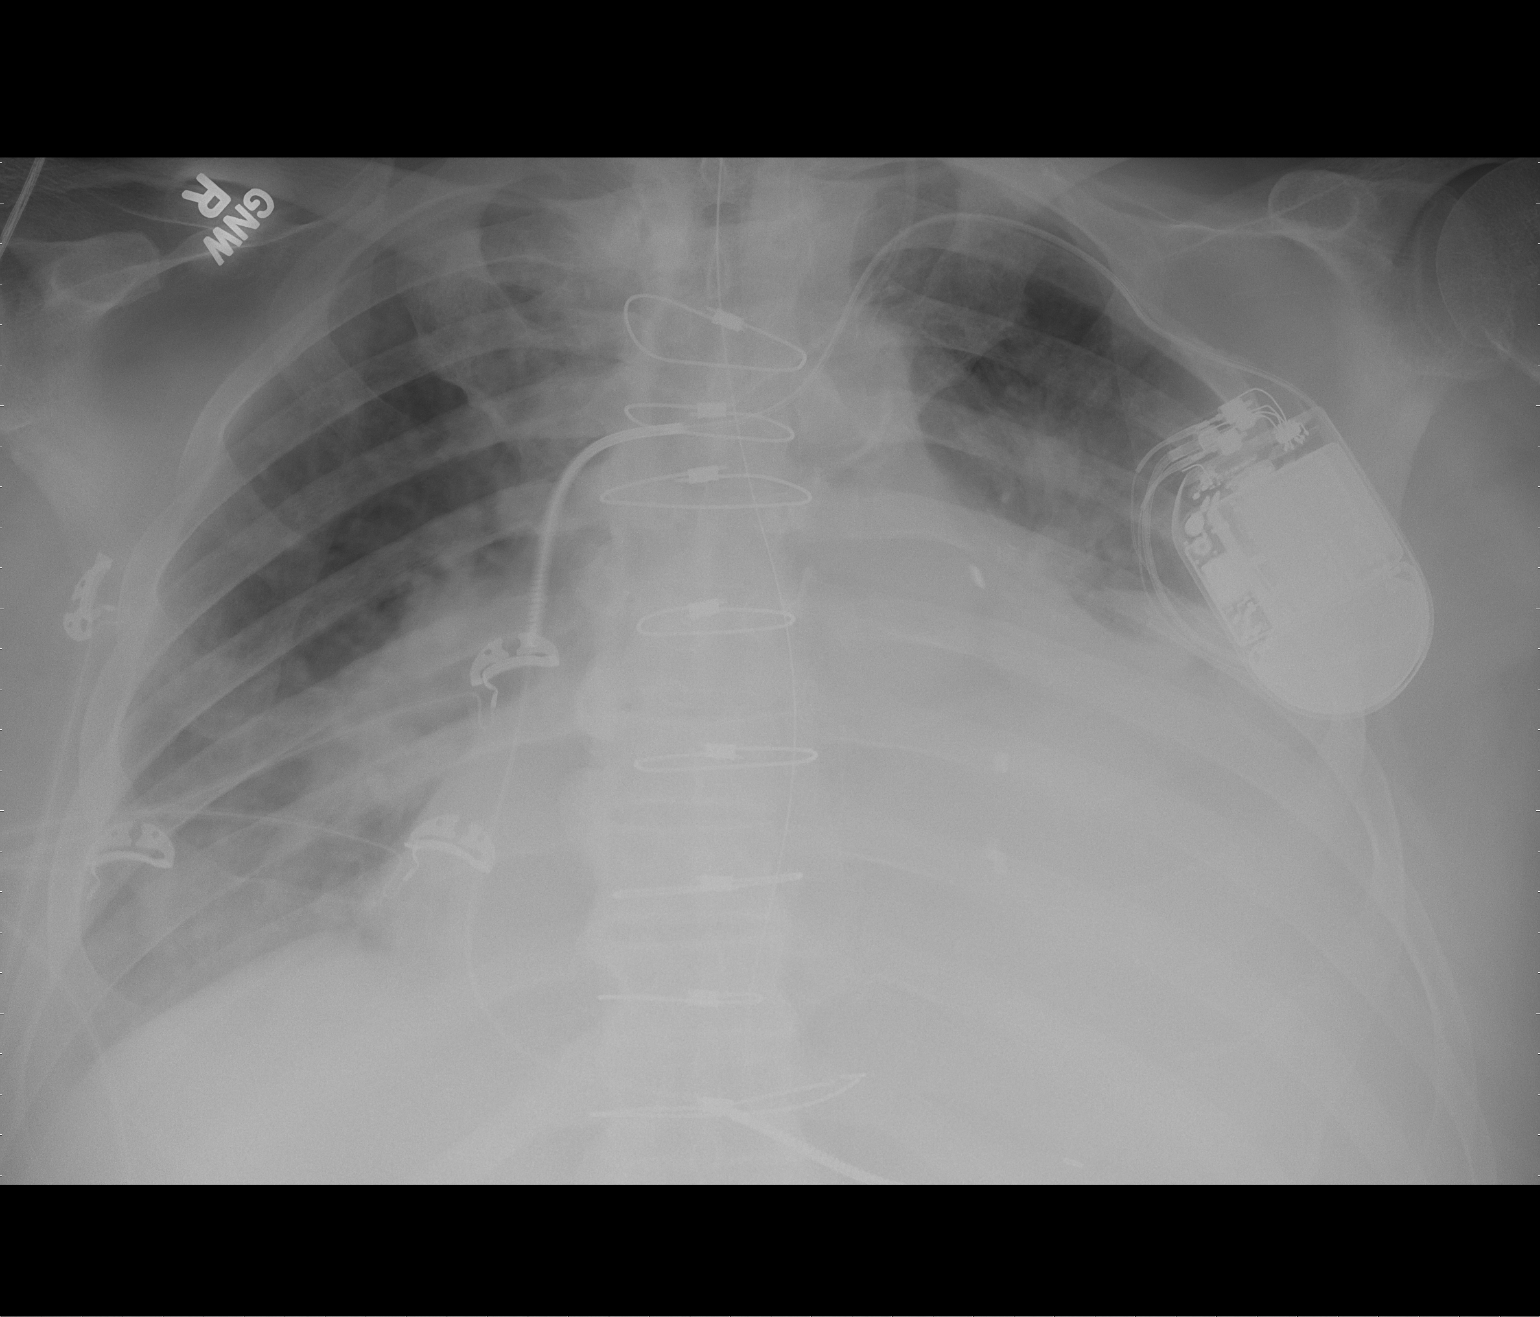

[1 of 1 positions shown; findings below may reference images not displayed]

FINDINGS: 1359 hours. The NG tube passes into the stomach although
the distal tip position is not included on the film.  Left IJ
central line tip projects in the region of the left innominate
vein.  Cardiopericardial silhouette remains markedly enlarged.  No
substantial change in the vascular congestion and pulmonary edema
pattern.  There is retrocardiac collapse / consolidation with
increasing right base atelectasis.
IMPRESSION: Marked enlargement the cardiopericardial silhouette with pulmonary
edema and bibasilar collapse / consolidation, left greater than
right.
